# Patient Record
Sex: Female | Born: 1960 | Race: Black or African American | Hispanic: No | Marital: Single | Smoking: Never smoker
Health system: Southern US, Community
[De-identification: ages and names within clinical notes are randomized; demographics above are authoritative.]

## PROBLEM LIST (undated history)

## (undated) DIAGNOSIS — D649 Anemia, unspecified: Secondary | ICD-10-CM

## (undated) DIAGNOSIS — E7800 Pure hypercholesterolemia, unspecified: Secondary | ICD-10-CM

## (undated) HISTORY — DX: Anemia, unspecified: D64.9

## (undated) HISTORY — DX: Pure hypercholesterolemia, unspecified: E78.00

## (undated) HISTORY — PX: TOTAL ABDOMINAL HYSTERECTOMY: SHX209

## (undated) HISTORY — PX: HEMORRHOID SURGERY: SHX153

## (undated) HISTORY — PX: TUBAL LIGATION: SHX77

---

## 1998-07-28 ENCOUNTER — Other Ambulatory Visit: Admission: RE | Admit: 1998-07-28 | Discharge: 1998-07-28 | Payer: Self-pay | Admitting: Family Medicine

## 1998-07-30 ENCOUNTER — Observation Stay (HOSPITAL_COMMUNITY): Admission: AD | Admit: 1998-07-30 | Discharge: 1998-07-31 | Payer: Self-pay | Admitting: Family Medicine

## 1998-08-04 ENCOUNTER — Ambulatory Visit (HOSPITAL_COMMUNITY): Admission: RE | Admit: 1998-08-04 | Discharge: 1998-08-04 | Payer: Self-pay | Admitting: Family Medicine

## 1998-08-04 ENCOUNTER — Encounter: Payer: Self-pay | Admitting: Family Medicine

## 1999-10-29 ENCOUNTER — Encounter: Admission: RE | Admit: 1999-10-29 | Discharge: 1999-10-29 | Payer: Self-pay | Admitting: Family Medicine

## 1999-10-29 ENCOUNTER — Encounter: Payer: Self-pay | Admitting: Family Medicine

## 1999-12-01 ENCOUNTER — Encounter: Admission: RE | Admit: 1999-12-01 | Discharge: 1999-12-01 | Payer: Self-pay | Admitting: Family Medicine

## 1999-12-01 ENCOUNTER — Encounter: Payer: Self-pay | Admitting: Family Medicine

## 2000-10-05 ENCOUNTER — Other Ambulatory Visit: Admission: RE | Admit: 2000-10-05 | Discharge: 2000-10-05 | Payer: Self-pay | Admitting: Family Medicine

## 2001-10-05 ENCOUNTER — Encounter: Admission: RE | Admit: 2001-10-05 | Discharge: 2001-10-05 | Payer: Self-pay | Admitting: Family Medicine

## 2001-10-05 ENCOUNTER — Encounter: Payer: Self-pay | Admitting: Family Medicine

## 2002-11-25 ENCOUNTER — Other Ambulatory Visit: Admission: RE | Admit: 2002-11-25 | Discharge: 2002-11-25 | Payer: Self-pay | Admitting: Family Medicine

## 2003-11-17 ENCOUNTER — Ambulatory Visit (HOSPITAL_COMMUNITY): Admission: RE | Admit: 2003-11-17 | Discharge: 2003-11-17 | Payer: Self-pay | Admitting: Family Medicine

## 2004-12-01 ENCOUNTER — Ambulatory Visit (HOSPITAL_COMMUNITY): Admission: RE | Admit: 2004-12-01 | Discharge: 2004-12-01 | Payer: Self-pay | Admitting: Family Medicine

## 2005-10-12 ENCOUNTER — Ambulatory Visit: Payer: Self-pay | Admitting: Family Medicine

## 2005-10-13 ENCOUNTER — Encounter: Payer: Self-pay | Admitting: Family Medicine

## 2005-10-13 ENCOUNTER — Other Ambulatory Visit: Admission: RE | Admit: 2005-10-13 | Discharge: 2005-10-13 | Payer: Self-pay | Admitting: Family Medicine

## 2005-10-13 ENCOUNTER — Ambulatory Visit: Payer: Self-pay | Admitting: Family Medicine

## 2005-10-27 ENCOUNTER — Ambulatory Visit: Payer: Self-pay | Admitting: Family Medicine

## 2005-11-24 ENCOUNTER — Ambulatory Visit: Payer: Self-pay | Admitting: Family Medicine

## 2005-12-06 ENCOUNTER — Ambulatory Visit (HOSPITAL_COMMUNITY): Admission: RE | Admit: 2005-12-06 | Discharge: 2005-12-06 | Payer: Self-pay | Admitting: Family Medicine

## 2005-12-23 ENCOUNTER — Ambulatory Visit: Payer: Self-pay | Admitting: Family Medicine

## 2006-02-04 ENCOUNTER — Ambulatory Visit: Payer: Self-pay | Admitting: Family Medicine

## 2006-05-11 ENCOUNTER — Inpatient Hospital Stay (HOSPITAL_COMMUNITY): Admission: RE | Admit: 2006-05-11 | Discharge: 2006-05-14 | Payer: Self-pay | Admitting: Obstetrics and Gynecology

## 2006-05-11 ENCOUNTER — Encounter (INDEPENDENT_AMBULATORY_CARE_PROVIDER_SITE_OTHER): Payer: Self-pay | Admitting: Specialist

## 2006-12-15 ENCOUNTER — Ambulatory Visit (HOSPITAL_COMMUNITY): Admission: RE | Admit: 2006-12-15 | Discharge: 2006-12-15 | Payer: Self-pay | Admitting: Family Medicine

## 2007-08-03 ENCOUNTER — Ambulatory Visit: Payer: Self-pay | Admitting: Family Medicine

## 2007-08-03 LAB — CONVERTED CEMR LAB
ALT: 24 units/L (ref 0–35)
AST: 20 units/L (ref 0–37)
Albumin: 3.5 g/dL (ref 3.5–5.2)
CO2: 27 meq/L (ref 19–32)
Cholesterol: 273 mg/dL (ref 0–200)
Eosinophils Relative: 1.3 % (ref 0.0–5.0)
GFR calc Af Amer: 140 mL/min
HCT: 34.9 % — ABNORMAL LOW (ref 36.0–46.0)
HDL: 46 mg/dL (ref >39.0)
Lymphocytes Relative: 46.2 % — ABNORMAL HIGH (ref 12.0–46.0)
MCHC: 34 g/dL (ref 30.0–36.0)
MCV: 83.7 fL (ref 78.0–100.0)
Neutrophils Relative %: 45.3 % (ref 43.0–77.0)
Platelets: 180 10*3/uL (ref 150–400)
RDW: 13.2 % (ref 11.5–14.6)
Specific Gravity, Urine: >=1.03
Total Bilirubin: 0.4 mg/dL (ref 0.3–1.2)
Urobilinogen, UA: 0.2
VLDL: 21 mg/dL (ref 0–40)
WBC Urine, dipstick: NEGATIVE
WBC: 3.6 10*3/uL — ABNORMAL LOW (ref 4.5–10.5)
pH: 5.5

## 2007-08-14 ENCOUNTER — Ambulatory Visit: Payer: Self-pay | Admitting: Family Medicine

## 2007-08-14 DIAGNOSIS — E782 Mixed hyperlipidemia: Secondary | ICD-10-CM

## 2007-08-14 DIAGNOSIS — E663 Overweight: Secondary | ICD-10-CM | POA: Insufficient documentation

## 2007-12-18 ENCOUNTER — Ambulatory Visit (HOSPITAL_COMMUNITY): Admission: RE | Admit: 2007-12-18 | Discharge: 2007-12-18 | Payer: Self-pay | Admitting: Family Medicine

## 2008-08-27 ENCOUNTER — Ambulatory Visit: Payer: Self-pay | Admitting: Family Medicine

## 2008-08-27 LAB — CONVERTED CEMR LAB
ALT: 23 units/L (ref 0–35)
AST: 20 units/L (ref 0–37)
Alkaline Phosphatase: 51 units/L (ref 39–117)
BUN: 10 mg/dL (ref 6–23)
Basophils Absolute: 0.1 10*3/uL (ref 0.0–0.1)
Bilirubin Urine: NEGATIVE
Bilirubin, Direct: 0.1 mg/dL (ref 0.0–0.3)
Blood in Urine, dipstick: NEGATIVE
Direct LDL: 192 mg/dL
Eosinophils Relative: 1.6 % (ref 0.0–5.0)
GFR calc Af Amer: 139 mL/min
GFR calc non Af Amer: 115 mL/min
Glucose, Urine, Semiquant: NEGATIVE
Ketones, urine, test strip: NEGATIVE
Lymphocytes Relative: 46.5 % — ABNORMAL HIGH (ref 12.0–46.0)
MCHC: 33.2 g/dL (ref 30.0–36.0)
Monocytes Absolute: 0.2 10*3/uL (ref 0.1–1.0)
Neutro Abs: 1.6 10*3/uL (ref 1.4–7.7)
Nitrite: NEGATIVE
Platelets: 165 10*3/uL (ref 150–400)
Protein, U semiquant: NEGATIVE
Sodium: 142 meq/L (ref 135–145)
Specific Gravity, Urine: >=1.03
Total CHOL/HDL Ratio: 5.1
Triglycerides: 102 mg/dL (ref 0–149)
Urobilinogen, UA: 0.2
VLDL: 20 mg/dL (ref 0–40)
WBC Urine, dipstick: NEGATIVE
WBC: 3.5 10*3/uL — ABNORMAL LOW (ref 4.5–10.5)
pH: 6

## 2008-09-01 ENCOUNTER — Ambulatory Visit: Payer: Self-pay | Admitting: Family Medicine

## 2008-09-01 DIAGNOSIS — R110 Nausea: Secondary | ICD-10-CM

## 2008-09-04 ENCOUNTER — Encounter: Admission: RE | Admit: 2008-09-04 | Discharge: 2008-09-04 | Payer: Self-pay | Admitting: Family Medicine

## 2008-12-19 ENCOUNTER — Encounter: Admission: RE | Admit: 2008-12-19 | Discharge: 2008-12-19 | Payer: Self-pay | Admitting: Family Medicine

## 2009-09-25 ENCOUNTER — Ambulatory Visit: Payer: Self-pay | Admitting: Family Medicine

## 2009-09-25 DIAGNOSIS — N764 Abscess of vulva: Secondary | ICD-10-CM

## 2009-09-25 LAB — CONVERTED CEMR LAB
AST: 22 units/L (ref 0–37)
Albumin: 3.7 g/dL (ref 3.5–5.2)
BUN: 8 mg/dL (ref 6–23)
Basophils Absolute: 0 10*3/uL (ref 0.0–0.1)
Basophils Relative: 0.9 % (ref 0.0–3.0)
Bilirubin Urine: NEGATIVE
Bilirubin, Direct: 0 mg/dL (ref 0.0–0.3)
CO2: 27 meq/L (ref 19–32)
Cholesterol: 233 mg/dL — ABNORMAL HIGH (ref 0–200)
Direct LDL: 183.4 mg/dL
Glucose, Bld: 88 mg/dL (ref 70–99)
Hemoglobin: 12 g/dL (ref 12.0–15.0)
Lymphs Abs: 1.6 10*3/uL (ref 0.7–4.0)
MCHC: 32.8 g/dL (ref 30.0–36.0)
MCV: 85.4 fL (ref 78.0–100.0)
Monocytes Relative: 5.8 % (ref 3.0–12.0)
Neutro Abs: 1.4 10*3/uL (ref 1.4–7.7)
Platelets: 152 10*3/uL (ref 150.0–400.0)
Potassium: 3.7 meq/L (ref 3.5–5.1)
Protein, U semiquant: NEGATIVE
RDW: 12.6 % (ref 11.5–14.6)
Sodium: 140 meq/L (ref 135–145)
Specific Gravity, Urine: 1.025
Total CHOL/HDL Ratio: 4
VLDL: 16 mg/dL (ref 0.0–40.0)

## 2009-10-09 ENCOUNTER — Ambulatory Visit: Payer: Self-pay | Admitting: Family Medicine

## 2009-12-21 ENCOUNTER — Encounter: Admission: RE | Admit: 2009-12-21 | Discharge: 2009-12-21 | Payer: Self-pay | Admitting: Family Medicine

## 2010-10-05 NOTE — Assessment & Plan Note (Signed)
Summary: CPX // RS   Vital Signs:  Patient profile:   50 year old female Menstrual status:  hysterectomy Height:      67 inches Weight:      207 pounds BMI:     32.54 Temp:     98.5 degrees F oral BP sitting:   130 / 90  (left arm) Cuff size:   regular  Vitals Entered By: Kern Reap CMA Duncan Dull) (October 09, 2009 10:43 AM)  Reason for Visit cpx  History of Present Illness: Kang is a 50 year old female, nonsmoker, who comes in today for physical examination  We saw her last year and her annual exam.  She was complaining of abdominal discomfort.  We set her up for an ultrasound of her gallbladder and advised her to come back for follow-up after the diagnostic study.  She never came back for follow-up.  She thought we were collar on the telephone.  In the meantime, her symptoms have persisted.  She has on a gas burping, belching, indigestion.  Sometimes related to food sometimes not.  She has no abdominal pain.  She takes no medication on a regular basis.  She had her uterus removed many years ago for dysfunction uterine bleeding.  No cancer.  She does not check her breasts monthly.  However, she states she does get regular mammogram.  Tetanus 2003 seasonal flu 2010  Allergies (verified): No Known Drug Allergies  Past History:  Past medical, surgical, family and social histories (including risk factors) reviewed, and no changes noted (except as noted below).  Past Medical History: Reviewed history from 08/14/2007 and no changes required. childbirth x 3 BTL TAH, nonmalignant reasons ovaries intact  Family History: Reviewed history from 08/14/2007 and no changes required. Family History Diabetes 1st degree relative Family History Hypertension  Social History: Reviewed history from 08/14/2007 and no changes required. Occupation: Married Never Smoked Alcohol use-no Drug use-no Regular exercise-yes  Review of Systems      See HPI  Physical Exam  General:   Well-developed,well-nourished,in no acute distress; alert,appropriate and cooperative throughout examination Head:  Normocephalic and atraumatic without obvious abnormalities. No apparent alopecia or balding. Eyes:  No corneal or conjunctival inflammation noted. EOMI. Perrla. Funduscopic exam benign, without hemorrhages, exudates or papilledema. Vision grossly normal. Ears:  External ear exam shows no significant lesions or deformities.  Otoscopic examination reveals clear canals, tympanic membranes are intact bilaterally without bulging, retraction, inflammation or discharge. Hearing is grossly normal bilaterally. Nose:  External nasal examination shows no deformity or inflammation. Nasal mucosa are pink and moist without lesions or exudates. Mouth:  Oral mucosa and oropharynx without lesions or exudates.  Teeth in good repair. Neck:  No deformities, masses, or tenderness noted. Chest Wall:  No deformities, masses, or tenderness noted. Breasts:  No mass, nodules, thickening, tenderness, bulging, retraction, inflamation, nipple discharge or skin changes noted.   Lungs:  Normal respiratory effort, chest expands symmetrically. Lungs are clear to auscultation, no crackles or wheezes. Heart:  Normal rate and regular rhythm. S1 and S2 normal without gallop, murmur, click, rub or other extra sounds. Abdomen:  Bowel sounds positive,abdomen soft and non-tender without masses, organomegaly or hernias noted. Rectal:  No external abnormalities noted. Normal sphincter tone. No rectal masses or tenderness. Genitalia:  Pelvic Exam:        External: normal female genitalia without lesions or masses        Vagina: normal without lesions or masses        Cervix: normal  without lesions or masses        Adnexa: normal bimanual exam without masses or fullness        Uterus: normal by palpation        Pap smear: not performed Msk:  No deformity or scoliosis noted of thoracic or lumbar spine.   Pulses:  R and L  carotid,radial,femoral,dorsalis pedis and posterior tibial pulses are full and equal bilaterally Extremities:  No clubbing, cyanosis, edema, or deformity noted with normal full range of motion of all joints.   Neurologic:  No cranial nerve deficits noted. Station and gait are normal. Plantar reflexes are down-going bilaterally. DTRs are symmetrical throughout. Sensory, motor and coordinative functions appear intact. Skin:  Intact without suspicious lesions or rashes Cervical Nodes:  No lymphadenopathy noted Axillary Nodes:  No palpable lymphadenopathy Inguinal Nodes:  No significant adenopathy Psych:  Cognition and judgment appear intact. Alert and cooperative with normal attention span and concentration. No apparent delusions, illusions, hallucinations   Impression & Recommendations:  Problem # 1:  OVERWEIGHT (ICD-278.02) Assessment Improved  Problem # 2:  Preventive Health Care (ICD-V70.0) Assessment: Unchanged  Patient Instructions: 1)  continue the diet, exercise and weight loss program.  If you decrease your caloric intake to 1500 calories daily drink 30 ounces of water daily and walk 15 minutes daily.  u  will lose 1 pound per week. 2)  Also going to complete lactose free diet for one week.  If y r  still symptomatic after going off lactose call Rachael and we will get you set up for a GI consult. 3)  Get her annual mammogram and do a complete breast exam monthly 4)  Please schedule a follow-up appointment in 1 year. 5)  Schedule your mammogram.   Immunization History:  Influenza Immunization History:    Influenza:  historical (06/05/2009)

## 2010-11-26 ENCOUNTER — Other Ambulatory Visit: Payer: Self-pay | Admitting: Family Medicine

## 2010-11-26 DIAGNOSIS — Z1231 Encounter for screening mammogram for malignant neoplasm of breast: Secondary | ICD-10-CM

## 2010-12-10 ENCOUNTER — Other Ambulatory Visit: Payer: Self-pay

## 2010-12-13 ENCOUNTER — Other Ambulatory Visit (INDEPENDENT_AMBULATORY_CARE_PROVIDER_SITE_OTHER): Payer: BC Managed Care – PPO | Admitting: Family Medicine

## 2010-12-13 DIAGNOSIS — Z0000 Encounter for general adult medical examination without abnormal findings: Secondary | ICD-10-CM

## 2010-12-13 DIAGNOSIS — E785 Hyperlipidemia, unspecified: Secondary | ICD-10-CM

## 2010-12-13 LAB — HEPATIC FUNCTION PANEL
Alkaline Phosphatase: 50 U/L (ref 39–117)
Total Bilirubin: 0.6 mg/dL (ref 0.3–1.2)
Total Protein: 7 g/dL (ref 6.0–8.3)

## 2010-12-13 LAB — BASIC METABOLIC PANEL
Creatinine, Ser: 0.7 mg/dL (ref 0.4–1.2)
GFR: 111.44 mL/min (ref >60.00)
Potassium: 3.3 mEq/L — ABNORMAL LOW (ref 3.5–5.1)

## 2010-12-13 LAB — CBC WITH DIFFERENTIAL/PLATELET
Basophils Absolute: 0 10*3/uL (ref 0.0–0.1)
Basophils Relative: 0.6 % (ref 0.0–3.0)
Eosinophils Absolute: 0.1 10*3/uL (ref 0.0–0.7)
Eosinophils Relative: 3.6 % (ref 0.0–5.0)
HCT: 36.5 % (ref 36.0–46.0)
Lymphocytes Relative: 36.5 % (ref 12.0–46.0)
MCHC: 34 g/dL (ref 30.0–36.0)
Neutro Abs: 2 10*3/uL (ref 1.4–7.7)
WBC: 3.9 10*3/uL — ABNORMAL LOW (ref 4.5–10.5)

## 2010-12-13 LAB — POCT URINALYSIS DIPSTICK
Glucose, UA: NEGATIVE
Nitrite, UA: NEGATIVE
Spec Grav, UA: 1.02
Urobilinogen, UA: 1
pH, UA: 6.5

## 2010-12-13 LAB — LIPID PANEL
Cholesterol: 241 mg/dL — ABNORMAL HIGH (ref 0–200)
VLDL: 8.2 mg/dL (ref 0.0–40.0)

## 2010-12-14 ENCOUNTER — Ambulatory Visit
Admission: RE | Admit: 2010-12-14 | Discharge: 2010-12-14 | Disposition: A | Discharge status: Home / Self Care | Payer: BC Managed Care – PPO | Source: Ambulatory Visit | Attending: Family Medicine | Admitting: Family Medicine

## 2010-12-14 DIAGNOSIS — Z1231 Encounter for screening mammogram for malignant neoplasm of breast: Secondary | ICD-10-CM

## 2010-12-17 ENCOUNTER — Ambulatory Visit (INDEPENDENT_AMBULATORY_CARE_PROVIDER_SITE_OTHER): Payer: BC Managed Care – PPO | Admitting: Family Medicine

## 2011-01-04 ENCOUNTER — Encounter: Payer: Self-pay | Admitting: Family Medicine

## 2011-01-04 ENCOUNTER — Ambulatory Visit (INDEPENDENT_AMBULATORY_CARE_PROVIDER_SITE_OTHER): Payer: BC Managed Care – PPO | Admitting: Family Medicine

## 2011-01-04 VITALS — Temp 98.4°F | Ht 67.5 in | Wt 191.0 lb

## 2011-01-04 DIAGNOSIS — G43719 Chronic migraine without aura, intractable, without status migrainosus: Secondary | ICD-10-CM

## 2011-01-04 MED ORDER — PREDNISONE 20 MG PO TABS: ORAL_TABLET | ORAL | Status: DC

## 2011-01-04 NOTE — Patient Instructions (Signed)
Take the prednisone as directed.  Return if the migraine does not go way.  Return yearly for annual checkup

## 2011-01-04 NOTE — Progress Notes (Signed)
  Subjective:    Patient ID: Brandi Diaz, female    DOB: May 13, 1961, 50 y.o.   MRN: [id number]  HPI  Brandi Diaz is a 50 year old female, nonsmoker, who comes in today for general physical examination,  She's had her uterus removed for nonmalignant lesions.  Ovaries were left intact.  She's had 3 children.  She is now having a headache for the last 2 1/2 weeks.  She says is chronic.  It hurts all day long.  In the, past.  She's had a history of intermittent migraines, never cluster.     Review of Systems  Constitutional: Negative.   Eyes: Negative.   Respiratory: Negative.   Cardiovascular: Negative.   Gastrointestinal: Negative.   Genitourinary: Negative.   Musculoskeletal: Negative.   Neurological: Positive for headaches.  Hematological: Negative.   Psychiatric/Behavioral: Negative.        Objective:   Physical Exam  Constitutional: She appears well-developed and well-nourished.  HENT:  Head: Normocephalic and atraumatic.  Right Ear: External ear normal.  Left Ear: External ear normal.  Nose: Nose normal.  Mouth/Throat: Oropharynx is clear and moist.  Eyes: EOM are normal. Pupils are equal, round, and reactive to light.  Neck: Normal range of motion. Neck supple. No thyromegaly present.  Cardiovascular: Normal rate, regular rhythm, normal heart sounds and intact distal pulses.  Exam reveals no gallop and no friction rub.   No murmur heard. Pulmonary/Chest: Effort normal and breath sounds normal.  Abdominal: Soft. Bowel sounds are normal. She exhibits no distension and no mass. There is no tenderness. There is no rebound.  Genitourinary: Vagina normal. Guaiac negative stool. No vaginal discharge found.       Bilateral breast exam normal  Musculoskeletal: Normal range of motion.  Lymphadenopathy:    She has no cervical adenopathy.  Neurological: She is alert. She has normal reflexes. No cranial nerve deficit. She exhibits normal muscle tone. Coordination normal.  Skin: Skin  is warm and dry.  Psychiatric: She has a normal mood and affect. Her behavior is normal. Judgment and thought content normal.          Assessment & Plan:  Cluster migraines.  Plan prednisone burst and taper

## 2011-01-21 NOTE — Op Note (Signed)
Brandi Diaz, Brandi Diaz                 ACCOUNT NO.:  [id number]   MEDICAL RECORD NO.:  [id number]          PATIENT TYPE:  INP   LOCATION:  9306                          FACILITY:  [facility]   PHYSICIAN:  Richardean Sale, M.D.   DATE OF BIRTH:  December 24, 1960   DATE OF PROCEDURE:  05/11/2006  DATE OF DISCHARGE:                                 OPERATIVE REPORT   PREOPERATIVE DIAGNOSIS:  Menometrorrhagia.   POSTOPERATIVE DIAGNOSES:  1. Menometrorrhagia.  2. Endometriosis.   SURGEON:  Dr. Richardean Sale   ASSISTANT:  Dr. Olivia Mackie   PROCEDURE:  Laparoscopy converted to total abdominal hysterectomy.   ANESTHESIA:  General.   COMPLICATIONS:  None.   ESTIMATED BLOOD LOSS:  46 mL.   OPERATIVE FINDINGS:   LAPAROSCOPIC FINDINGS:  Bulky, enlarged uterus with minimal lateral  mobility, unable to access the uterine vessels laterally.  Normal-appearing  ovaries and tubes.  Normal-appearing liver and gallbladder, adhesions  involving the bladder peritoneum to the lower uterine segment, endometriosis  involving the right uterosacral ligament.   SPECIMENS:  Uterus and cervix sent to pathology.   INDICATIONS:  This is a 44-year gravity 3, para 3 black female who has a  history of progressively worsening menometrorrhagia that is unable to be  controlled with birth control pills or progestins.  The patient presents  today for definitive management with hysterectomy.  She has been counseled  on the different operative approaches as well as alternatives to  hysterectomy, and the patient desires to pursue hysterectomy.  Plan is to  proceed with a total laparoscopic hysterectomy if surgically feasible.  Prior to the procedure, I counseled the patient on the possibility of having  to convert to an open procedure given the patient's history of 3 prior  cesarean deliveries and the potential for scar tissue.  The patient voices  understanding of all of the above.  We discussed the risks of the procedure  which include but are not limited to hemorrhage requiring transfusion;  infection; injury to the bowel, the bladder, the ureters, or other organs  which could require additional surgery; the possibility of deep vein  thrombosis and anesthetic-related complications.  The patient voiced  understanding of all of the above and desires to proceed.  Informed consent  has been obtained and all questions answered.   PROCEDURE:  The patient was taken to the operating room where she was given  a general anesthetic.  She was then prepped and draped in the usual sterile  fashion with Betadine.  A Foley catheter was then placed.  A speculum was  placed in the vagina, and the cervix was visualized.  The cervix was grasped  with a single-tooth tenaculum.  The uterus sounded to 8 cm.  The Rumi  manipulator was then placed into the uterus and the manipulator balloon and  the occluding balloon were then instilled with sterile water.  Speculum was  removed, and attention was then turned to the patient's abdomen.  A 10 mm  skin incision was made with the scalpel.  Prior to doing so, 5 mL of 0.25%  plain Marcaine were injected.  This infraumbilical incision was then carried  down to the fascia.  The fascia was then grasped between 2 Kocher clamps and  incised with Mayo scissors.  The peritoneum was then identified bluntly.  The fascial incision was then secured with a pursestring Vicryl suture, and  the Hasson trocar and sleeve were then introduced.  CO2 gas was allowed to  insufflate, and the laparoscope was introduced.  Upon inspection of the  abdomen and pelvis, the uterus was approximately 14 weeks' size, very bulky  with minimal lateral mobility.  There was endometriosis in the posterior cul-  de-sac over the right uterosacral ligament.  Both ovaries and fallopian  tubes appeared normal.  In the anterior cul-de-sac there were adhesions  involving the bladder flap.  After careful consideration given the  lack of  bladder mobility and inability to safely access and secure the uterine  arteries, the decision was made to terminate the laparoscopic attempt at  hysterectomy and proceed with a total abdominal hysterectomy.  At this  point, CO2 gas was allowed to escape.  The Hasson trocar and sleeve all  instruments were removed from the patient's abdomen.  The fascial  pursestring suture was then tied, and Pfannenstiel skin incision was then  made through the patient's prior cesarean section scar.  This was carried  down sharply to the fascia.  There was a moderate amount of scar tissue  encountered.  The fascia was incised in the midline, and the fascial  incision was then extended laterally with the Mayo scissors.  The superior  and inferior aspects of the fascial incision were then grasped with Kocher  clamps, elevated, and the underlying rectus muscles dissected off with both  sharp and blunt dissection.  The muscles were then separated in the midline,  and peritoneum was entered sharply.  This incision was then extended with  good visualization of the bladder.  There was a moderate amount of scarring  involving the rectus muscles and fascia.  The Balfour retractor was then  placed, and the bowel was packed away with moist laparotomy sponges.  On the  left side, there were adhesion of the colon to the pelvic sidewall that were  obscuring visualization of the ovary.  These adhesions were taken down with  sharp dissection.  At this point, the round ligaments on both sides were  then doubly suture ligated and transected.  The utero-ovarian ligaments on  both sides were then doubly clamped, transected, and suture ligated with  Vicryl suture and were hemostatic.  At this point, the anterior leaf of the  broad ligament was then incised on both sides and the bladder, which was  quite adhered to the lower end segment, was very carefully dissected off the lower segment and cervix with sharp  dissection.  Any areas of bleeding  encountered were cauterized with the Bovie.  Once the bladder was felt to be  adequately displaced, attention was then turned to the uterine arteries  which were skeletonized on both sides.  The uterine arteries were then  doubly clamped, transected, and suture ligated on both sides and were  hemostatic.  The cardinal ligaments were then serially clamped, transected,  and suture ligated.  There was a small amount of backbleeding encountered.  This was cauterized with the Bovie.  The uterosacral ligaments on both sides  were then clamped, transected, and suture ligated.  The bladder was  reinspected, and further sharp dissection was performed to displace the  bladder inferiorly adequately to safely secure the vaginal cuff angles.  Once this was done, 2 curved Heaney clamps were clamped across the vagina  just beneath the cervix, and the uterus and cervix were amputated with the  curved hysterectomy scissors.  The vaginal cuff was then closed with figure-  of-eight Vicryl sutures.  Any areas of bleeding were cauterized with the  Bovie.  At this point, the pelvis was very carefully inspected on the right  side, and the uterine artery pedicle was oozing slightly.  This was grasped  with a right angle, and a Vicryl suture was placed with good hemostasis.  The ureter was inspected and was well beneath the area of bleeding.  The  peritoneum on the left lateral sidewall was oozing.  This was cauterized  with the Bovie.  Any areas on the vaginal cuff that were bleeding were also  cauterized with the Bovie.  A piece of Gelfoam was placed over the vaginal  cuff for additional hemostasis.  The round ligaments were then inspected and  were hemostatic.  The utero-ovarian ligaments were inspected.  On the left  side, there was a small amount of oozing coming from the pedicle.  This was  grasped with a right angle, and an additional suture was placed with good   hemostasis on the utero-ovarian pedicle.  At this point, the pelvis was  irrigated, and all pedicles and the vaginal cuff were very carefully  inspected and were hemostatic.  The bowel was unpacked, and the Balfour  retractor was removed.  The rectus muscles and peritoneum were all carefully  inspected.  Any areas of bleeding were cauterized with the Bovie.  Once  hemostasis was assured and sponge, lap, and needle counts were correct, the  fascia was then closed with a running Vicryl suture.  The subcutaneous space  was then irrigated.  Any areas of bleeding were cauterized with the Bovie,  and the skin was then closed with staples.  The infraumbilical laparoscopy  incision was then closed with a running 4-0 Vicryl subcuticular stitch.   The patient tolerated the procedure very well.  All sponge, lap, needle, and  instrument counts were correct x2.  The patient was taken to the recovery  room awake and in stable condition.  There were no complications.  ADDENDUM:  Please note that prior to proceeding with the abdominal  hysterectomy, the Rumi uterine manipulator was removed.      Richardean Sale, M.D.  Electronically Signed     JW/MEDQ  D:  05/11/2006  T:  05/11/2006  Job:  [id number]

## 2011-01-21 NOTE — Discharge Summary (Signed)
Brandi Diaz, WOOLEN                 ACCOUNT NO.:  [id number]   MEDICAL RECORD NO.:  [id number]           PATIENT TYPE:   LOCATION:                                FACILITY:  [facility]   PHYSICIAN:  Richardean Sale, M.D.        DATE OF BIRTH:   DATE OF ADMISSION:  05/11/2006  DATE OF DISCHARGE:  05/14/2006                                 DISCHARGE SUMMARY   ADMISSION DIAGNOSES:  1. Abnormal uterine bleeding.  2. Menometrorrhagia.   DISCHARGE DIAGNOSES:  1. Abnormal uterine bleeding.  2. Adenomyosis.  3. Uterine fibroids.   PROCEDURES:  Total abdominal hysterectomy performed on May 11, 2006.   HOSPITAL COURSE/HISTORY OF PRESENT ILLNESS:  Please see admission history  and physical for details.  Briefly, this is a 50 year old gravida 3, para 3,  African-American female with progressively worsening menometrorrhagia.  It  has not been controlled with oral hormonal medications.  The patient  presented on May 11, 2006 for definitive management with hysterectomy.  An attempt was made at laparoscopic hysterectomy; however, given the  patient's intra-abdominal scarring from her three prior cesarean sections  and width of the uterus and inability to access the uterine vasculature  safely, the procedure was converted to a total abdominal hysterectomy.  The  patient's procedure was uncomplicated.  Her postoperative course was  uncomplicated.  She remained afebrile throughout her hospital stay.  On  postoperative day #3 she was ambulating well.  She was voiding without  difficulty, passing flatus, and her pain was controlled with oral pain  medications.  She was subsequently discharged to home on postoperative day  #3 in good condition.   LABORATORY DATA:  On postoperative day #1 hemoglobin was 10, hematocrit 30,  platelets 263, white blood cell count 7.5.  Pathology report showed 388 g  uterus with adenomyosis, the largest area measuring 6 x 6 x5 cm.  The total  uterine size was 14 x 9 x 8  cm.  There was no hyperplasia identified.   FOLLOWUP:  The patient will follow up in four weeks for a routine  postoperative visit and again in 48 hours to have her staples removed.   MEDICATIONS:  1. Prescriptions given for Percocet 1-2 tabs p.o. q.4-6 h. p.r.n. pain  2. She is to use Motrin 600 mg p.o. q.6 h. p.r.n. pain.  3. Continue on her iron supplement for an additional month.   INSTRUCTIONS:  The patient is to call for any fever greater than 100.4,  redness or drainage from her incision, increasing pain not relieved with  pain medications, vomiting, or any other questions.      Richardean Sale, M.D.  Electronically Signed     JW/MEDQ  D:  05/14/2006  T:  05/14/2006  Job:  [id number]

## 2011-12-14 ENCOUNTER — Other Ambulatory Visit: Payer: Self-pay | Admitting: Family Medicine

## 2011-12-14 DIAGNOSIS — Z1231 Encounter for screening mammogram for malignant neoplasm of breast: Secondary | ICD-10-CM

## 2012-01-02 ENCOUNTER — Encounter: Payer: BC Managed Care – PPO | Admitting: Family Medicine

## 2012-01-05 ENCOUNTER — Ambulatory Visit (INDEPENDENT_AMBULATORY_CARE_PROVIDER_SITE_OTHER): Payer: BC Managed Care – PPO | Admitting: Family Medicine

## 2012-01-05 ENCOUNTER — Encounter: Payer: Self-pay | Admitting: Family Medicine

## 2012-01-05 VITALS — BP 110/80 | Temp 98.3°F | Ht 68.0 in | Wt 210.0 lb

## 2012-01-05 DIAGNOSIS — Z0000 Encounter for general adult medical examination without abnormal findings: Secondary | ICD-10-CM | POA: Insufficient documentation

## 2012-01-05 DIAGNOSIS — E663 Overweight: Secondary | ICD-10-CM

## 2012-01-05 DIAGNOSIS — G43719 Chronic migraine without aura, intractable, without status migrainosus: Secondary | ICD-10-CM

## 2012-01-05 DIAGNOSIS — Z23 Encounter for immunization: Secondary | ICD-10-CM

## 2012-01-05 LAB — LIPID PANEL
HDL: 50.3 mg/dL (ref >39.00)
Triglycerides: 144 mg/dL (ref 0.0–149.0)
VLDL: 28.8 mg/dL (ref 0.0–40.0)

## 2012-01-05 LAB — HEPATIC FUNCTION PANEL
Albumin: 3.1 g/dL — ABNORMAL LOW (ref 3.5–5.2)
Bilirubin, Direct: 0 mg/dL (ref 0.0–0.3)

## 2012-01-05 LAB — CBC WITH DIFFERENTIAL/PLATELET
Basophils Absolute: 0 10*3/uL (ref 0.0–0.1)
Basophils Relative: 0.4 % (ref 0.0–3.0)
Eosinophils Absolute: 0.1 10*3/uL (ref 0.0–0.7)
Eosinophils Relative: 2.3 % (ref 0.0–5.0)
Lymphocytes Relative: 40.1 % (ref 12.0–46.0)
Lymphs Abs: 2.1 10*3/uL (ref 0.7–4.0)
MCHC: 32.9 g/dL (ref 30.0–36.0)
MCV: 84.6 fl (ref 78.0–100.0)
Monocytes Relative: 5.9 % (ref 3.0–12.0)
RBC: 4.29 Mil/uL (ref 3.87–5.11)
WBC: 5.2 10*3/uL (ref 4.5–10.5)

## 2012-01-05 LAB — POCT URINALYSIS DIPSTICK
Ketones, UA: NEGATIVE
Leukocytes, UA: NEGATIVE
Nitrite, UA: NEGATIVE
Spec Grav, UA: 1.02
pH, UA: 6.5

## 2012-01-05 LAB — BASIC METABOLIC PANEL
CO2: 28 mEq/L (ref 19–32)
Calcium: 8.6 mg/dL (ref 8.4–10.5)
Chloride: 107 mEq/L (ref 96–112)
Creatinine, Ser: 0.5 mg/dL (ref 0.4–1.2)
GFR: 177.14 mL/min (ref >60.00)
Potassium: 4.1 mEq/L (ref 3.5–5.1)
Sodium: 140 mEq/L (ref 135–145)

## 2012-01-05 MED ORDER — TRAMADOL HCL 50 MG PO TABS: ORAL_TABLET | ORAL | Status: DC

## 2012-01-05 MED ORDER — NADOLOL 20 MG PO TABS: 20.0000 mg | ORAL_TABLET | Freq: Every day | ORAL | Status: DC

## 2012-01-05 NOTE — Progress Notes (Signed)
  Subjective:    Patient ID: Brandi Diaz, female    DOB: [birth year], 51 y.o.   MRN: [id number]  HPI Brandi Diaz is a 51 year old single female nonsmoker who lives with her mother Brandi Diaz who comes in today for general medical examination  She had her uterus removed 10 years ago for dysfunctional uterine bleeding ovaries were left intact  She's had a history of migraine headaches they've decreased in intensity but now they occur mostly every day.  She does have trouble with some fungal infection on her fingernails  Her weight continues to be an issue she 68 inches tall and weighs 210 pounds blood pressure normal  She gets routine eye care, dental care, BSE monthly, and you mammography   Review of Systems  Constitutional: Negative.   HENT: Negative.   Eyes: Negative.   Respiratory: Negative.   Cardiovascular: Negative.   Gastrointestinal: Negative.   Genitourinary: Negative.   Musculoskeletal: Negative.   Neurological: Negative.   Hematological: Negative.   Psychiatric/Behavioral: Negative.        Objective:   Physical Exam  Constitutional: She appears well-developed and well-nourished.  HENT:  Head: Normocephalic and atraumatic.  Right Ear: External ear normal.  Left Ear: External ear normal.  Nose: Nose normal.  Mouth/Throat: Oropharynx is clear and moist.  Eyes: EOM are normal. Pupils are equal, round, and reactive to light.  Neck: Normal range of motion. Neck supple. No thyromegaly present.  Cardiovascular: Normal rate, regular rhythm, normal heart sounds and intact distal pulses.  Exam reveals no gallop and no friction rub.   No murmur heard. Pulmonary/Chest: Effort normal and breath sounds normal.  Abdominal: Soft. Bowel sounds are normal. She exhibits no distension and no mass. There is no tenderness. There is no rebound.  Genitourinary: Vagina normal. Guaiac negative stool. No vaginal discharge found.  Musculoskeletal: Normal range of motion.  Lymphadenopathy:     She has no cervical adenopathy.  Neurological: She is alert. She has normal reflexes. No cranial nerve deficit. She exhibits normal muscle tone. Coordination normal.  Skin: Skin is warm and dry.  Psychiatric: She has a normal mood and affect. Her behavior is normal. Judgment and thought content normal.          Assessment & Plan:  Healthy female  Overweight again encouraged diet exercise and weight loss  Migraine headaches continue Motrin tramadol one half tab when necessary  Also add Corgard 20 mg daily to try to break this cycle of the migraines daily  Status post hysterectomy

## 2012-01-05 NOTE — Patient Instructions (Signed)
Begin D. Corgard one tablet daily to prevent the migraine headache  Motrin 600 mg as needed for breakthrough migraine  Tramadol one half to one tablet as needed if the Motrin does not work for the migraine  Return in one month for followup

## 2012-01-13 ENCOUNTER — Ambulatory Visit
Admission: RE | Admit: 2012-01-13 | Discharge: 2012-01-13 | Disposition: A | Discharge status: Home / Self Care | Payer: BC Managed Care – PPO | Source: Ambulatory Visit | Attending: Family Medicine | Admitting: Family Medicine

## 2012-01-13 DIAGNOSIS — Z1231 Encounter for screening mammogram for malignant neoplasm of breast: Secondary | ICD-10-CM

## 2012-02-06 ENCOUNTER — Ambulatory Visit: Payer: BC Managed Care – PPO | Admitting: Family Medicine

## 2012-08-17 ENCOUNTER — Telehealth: Payer: Self-pay | Admitting: Family Medicine

## 2012-08-17 NOTE — Telephone Encounter (Signed)
Patient Information:  Caller Name: Rufina  Phone: [phone]  Patient: Brandi Diaz, Brandi Diaz  Gender: Female  DOB: Oct 19, 1960  Age: 51 Years  PCP: Kelle Darting [facility])  Pregnant: No  Office Follow Up:  Does the office need to follow up with this patient?: No  Instructions For The Office: N/A   Symptoms  Reason For Call & Symptoms: Has headache "for a while". Has histiory of migraines.  Reviewed Health History In EMR: Yes  Reviewed Medications In EMR: Yes  Reviewed Allergies In EMR: Yes  Reviewed Surgeries / Procedures: Yes  Date of Onset of Symptoms: 07/18/2012  Treatments Tried: BC Powder  Treatments Tried Worked: No OB:  LMP: Unknown  Guideline(s) Used:  Headache  Headache  Disposition Per Guideline:  See provider today or tomorrow. Appointment scheduled for 12-14 at 0915.  Reason For Disposition Reached:   Mild-moderate headache  Advice Given:  Reassurance - Migraine Headache:  This sounds like a painful headache that you are having, but there are pain medications you can take and other instructions I can give you to reduce the pain.  Pain Medicines:  For pain relief, you can take either acetaminophen, ibuprofen, or naproxen.  Apply Cold to the Area:   Apply a cold wet washcloth or cold pack to the forehead for 20 minutes.

## 2012-08-18 ENCOUNTER — Encounter: Payer: Self-pay | Admitting: Internal Medicine

## 2012-08-18 ENCOUNTER — Ambulatory Visit (INDEPENDENT_AMBULATORY_CARE_PROVIDER_SITE_OTHER): Payer: BC Managed Care – PPO | Admitting: Internal Medicine

## 2012-08-18 VITALS — BP 126/88 | HR 94 | Temp 98.2°F | Ht 68.0 in | Wt 210.0 lb

## 2012-08-18 DIAGNOSIS — R51 Headache: Secondary | ICD-10-CM

## 2012-08-18 DIAGNOSIS — Z0000 Encounter for general adult medical examination without abnormal findings: Secondary | ICD-10-CM

## 2012-08-18 DIAGNOSIS — G43719 Chronic migraine without aura, intractable, without status migrainosus: Secondary | ICD-10-CM

## 2012-08-18 DIAGNOSIS — E663 Overweight: Secondary | ICD-10-CM

## 2012-08-18 MED ORDER — AMITRIPTYLINE HCL 25 MG PO TABS: 25.0000 mg | ORAL_TABLET | Freq: Every day | ORAL | Status: DC

## 2012-08-18 MED ORDER — TRAMADOL HCL 50 MG PO TABS: 50.0000 mg | ORAL_TABLET | Freq: Every day | ORAL | Status: DC | PRN

## 2012-08-18 NOTE — Patient Instructions (Signed)
Please stop all regular pain relievers Use the prescription tramadol only for most severe headache and at most once a day Make sure you are not taking any caffeine Set up a follow up with Dr Tawanna Cooler in 2 weeks or so

## 2012-08-18 NOTE — Assessment & Plan Note (Addendum)
Clearly has converted her migraine into a chronic daily headache Nadolol didn't really help Discussed the need to stop daily analgesics Stop caffeine (may be in that morning drink) Trial of amitryipylline 25-50 Tramadol only for most severe headache  Needs follow up soon with Dr Tawanna Cooler May need to consider referral to headache specialist

## 2012-08-18 NOTE — Progress Notes (Signed)
  Subjective:    Patient ID: Brandi Diaz, female    DOB: Jun 28, 1961, 51 y.o.   MRN: [id number]  HPI Having a bad headache Not sure if it is a migraine but probably Started a couple of months ago to be worse but has been ongoing Daily headaches for many months  Was given nadolol but found no help Doubled up to 40mg  but still no help---so she stopped  Only thing that helped was BCs Taking every 3-4 hours over past week Extra strength tylenol not helping Takes something everyday  Ears are clogged and hearing is different Pounding all over head May be some better by putting direct pressure on it Movement worsens it Lying down does help  No sig photophobia or phonophobia No sig nausea now---only rare nausea and had 1 vomiting spell 5 days ago  Generally doesn't use any caffeine--- does have breakfast chocolate shake  Current Outpatient Prescriptions on File Prior to Visit  Medication Sig Dispense Refill  . nadolol (CORGARD) 20 MG tablet Take 1 tablet (20 mg total) by mouth daily.  100 tablet  3  . traMADol (ULTRAM) 50 MG tablet One half tablet every 6-8 hours when necessary for breakthrough migraine  30 tablet  3    No Known Allergies  No past medical history on file.  Past Surgical History  Procedure Date  . Tubal ligation   . Total abdominal hysterectomy     nonmalignant reasons - ovaries intact    Family History  Problem Relation Age of Onset  . Diabetes Other   . Hypertension Other     History   Social History  . Marital Status: Single    Spouse Name: N/A    Number of Children: N/A  . Years of Education: N/A   Occupational History  . Not on file.   Social History Main Topics  . Smoking status: Never Smoker   . Smokeless tobacco: Not on file  . Alcohol Use: No  . Drug Use:   . Sexually Active:    Other Topics Concern  . Not on file   Social History Narrative  . No narrative on file   Review of Systems No unilateral vision loss No facial  droop, focal weakness, aphasia or dysphagia    Objective:   Physical Exam  Constitutional: She appears well-developed and well-nourished.       Clearly uncomfortable and in pain  HENT:  Mouth/Throat: Oropharynx is clear and moist. No oropharyngeal exudate.  Eyes: Conjunctivae normal and EOM are normal. Pupils are equal, round, and reactive to light.  Neck: Normal range of motion. No thyromegaly present.  Lymphadenopathy:    She has no cervical adenopathy.  Neurological: She is alert. She has normal strength. She displays no atrophy and no tremor. No cranial nerve deficit. She exhibits normal muscle tone. She displays a negative Romberg sign. Coordination and gait normal.          Assessment & Plan:

## 2012-09-24 ENCOUNTER — Ambulatory Visit (INDEPENDENT_AMBULATORY_CARE_PROVIDER_SITE_OTHER): Payer: BC Managed Care – PPO | Admitting: Family Medicine

## 2012-09-24 ENCOUNTER — Encounter: Payer: Self-pay | Admitting: Family Medicine

## 2012-09-24 VITALS — BP 140/96 | Temp 98.5°F | Wt 218.0 lb

## 2012-09-24 DIAGNOSIS — E663 Overweight: Secondary | ICD-10-CM

## 2012-09-24 DIAGNOSIS — R030 Elevated blood-pressure reading, without diagnosis of hypertension: Secondary | ICD-10-CM

## 2012-09-24 DIAGNOSIS — G43719 Chronic migraine without aura, intractable, without status migrainosus: Secondary | ICD-10-CM

## 2012-09-24 MED ORDER — AMITRIPTYLINE HCL 50 MG PO TABS: 50.0000 mg | ORAL_TABLET | Freq: Every day | ORAL | Status: DC

## 2012-09-24 NOTE — Patient Instructions (Addendum)
Continue Elavil 50 mg one daily at bedtime  Set up an appointment in May for general physical exam  Fasting labs one week prior  Okay to donate plasma  Purchase a digital blood pressure cuff and check your blood pressure weekly at home. When you return in may bring a record of all your blood pressure readings  Blood pressure goal 135/85 or less

## 2012-09-24 NOTE — Progress Notes (Signed)
  Subjective:    Patient ID: Brandi Diaz, female    DOB: 12-16-1960, 52 y.o.   MRN: [id number]  HPI Brandi Diaz is a 52 year old married female nonsmoker who comes in today for evaluation of 2 problems  She was seen in the Saturday clinic for chronic daily headaches and was given amitriptyline 50 mg daily and comes back today for followup. She states the tramadol didn't help the pain but now her headaches are pretty much gone on the amitriptyline. No side effects from the medication.  She was first diagnosed as migraines in 1998 and up until this time they've been episodic. In the winter they became chronic and she was seen in the Saturday clinic as noted above.  She donates plasma and recently was denied because her blood pressure was elevated. BP when she came in was 140/96  After her exam BP rechecked 130/80  Positive family history of hypertension   Review of Systems    review of systems otherwise negative Objective:   Physical Exam Well-developed well-nourished female no acute distress BP right arm sitting position initially 140/96 at the end of the session was 130/80.       Assessment & Plan:  Cluster migraines much improved by Elavil 50 mg daily continue current medication  Elevated blood pressure okay to donate plasma BP checked daily at home followup CPX when due

## 2012-12-18 ENCOUNTER — Other Ambulatory Visit: Payer: BC Managed Care – PPO

## 2012-12-18 ENCOUNTER — Other Ambulatory Visit (INDEPENDENT_AMBULATORY_CARE_PROVIDER_SITE_OTHER): Payer: BC Managed Care – PPO

## 2012-12-18 DIAGNOSIS — R030 Elevated blood-pressure reading, without diagnosis of hypertension: Secondary | ICD-10-CM

## 2012-12-18 LAB — CBC WITH DIFFERENTIAL/PLATELET
Eosinophils Absolute: 0.1 10*3/uL (ref 0.0–0.7)
Eosinophils Relative: 2.6 % (ref 0.0–5.0)
Lymphocytes Relative: 44.6 % (ref 12.0–46.0)
MCV: 84.9 fl (ref 78.0–100.0)
Monocytes Absolute: 0.3 10*3/uL (ref 0.1–1.0)
Neutrophils Relative %: 43.4 % (ref 43.0–77.0)
Platelets: 196 10*3/uL (ref 150.0–400.0)
WBC: 3.9 10*3/uL — ABNORMAL LOW (ref 4.5–10.5)

## 2012-12-18 LAB — LIPID PANEL
Cholesterol: 267 mg/dL — ABNORMAL HIGH (ref 0–200)
Triglycerides: 98 mg/dL (ref 0.0–149.0)

## 2012-12-18 LAB — POCT URINALYSIS DIPSTICK
Ketones, UA: NEGATIVE
Leukocytes, UA: NEGATIVE
Urobilinogen, UA: 1

## 2012-12-18 LAB — HEPATIC FUNCTION PANEL
ALT: 20 U/L (ref 0–35)
AST: 20 U/L (ref 0–37)
Total Bilirubin: 0.6 mg/dL (ref 0.3–1.2)
Total Protein: 7.3 g/dL (ref 6.0–8.3)

## 2012-12-18 LAB — BASIC METABOLIC PANEL
Calcium: 8.9 mg/dL (ref 8.4–10.5)
Creatinine, Ser: 0.7 mg/dL (ref 0.4–1.2)

## 2012-12-18 LAB — TSH: TSH: 0.78 u[IU]/mL (ref 0.35–5.50)

## 2012-12-18 LAB — LDL CHOLESTEROL, DIRECT: Direct LDL: 203.4 mg/dL

## 2013-01-07 ENCOUNTER — Encounter: Payer: BC Managed Care – PPO | Admitting: Family Medicine

## 2013-01-07 ENCOUNTER — Encounter: Payer: Self-pay | Admitting: Family Medicine

## 2013-01-07 ENCOUNTER — Ambulatory Visit (INDEPENDENT_AMBULATORY_CARE_PROVIDER_SITE_OTHER): Payer: BC Managed Care – PPO | Admitting: Family Medicine

## 2013-01-07 VITALS — BP 120/86 | Temp 98.0°F | Ht 67.5 in | Wt 217.0 lb

## 2013-01-07 DIAGNOSIS — G43719 Chronic migraine without aura, intractable, without status migrainosus: Secondary | ICD-10-CM

## 2013-01-07 DIAGNOSIS — E663 Overweight: Secondary | ICD-10-CM

## 2013-01-07 DIAGNOSIS — E782 Mixed hyperlipidemia: Secondary | ICD-10-CM

## 2013-01-07 DIAGNOSIS — Z0000 Encounter for general adult medical examination without abnormal findings: Secondary | ICD-10-CM

## 2013-01-07 MED ORDER — AMITRIPTYLINE HCL 50 MG PO TABS: 50.0000 mg | ORAL_TABLET | Freq: Every day | ORAL | Status: DC

## 2013-01-07 NOTE — Patient Instructions (Signed)
Continue the Elavil 50 mg,,,,,,,,,,, 1 at bedtime  Continue exercise program  Do a thorough breast exam monthly and get a mammogram yearly  Return in one year sooner if any problems

## 2013-01-07 NOTE — Progress Notes (Signed)
  Subjective:    Patient ID: Brandi Diaz, female    DOB: 28-Mar-1961, 52 y.o.   MRN: [id number]  HPI Brandi Diaz is a 52 year old female nonsmoker who comes in today for general physical examination  She takes 50 mg of Elavil each bedtime to prevent migraine headaches. She says she is fairly headache free on the Elavil and feels fine.  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  She had her uterus removed for dysfunction uterine bleeding no cancer ovaries were left intact.   Review of Systems  Constitutional: Negative.   HENT: Negative.   Eyes: Negative.   Respiratory: Negative.   Cardiovascular: Negative.   Gastrointestinal: Negative.   Genitourinary: Negative.   Musculoskeletal: Negative.   Neurological: Negative.   Psychiatric/Behavioral: Negative.        Objective:   Physical Exam  Constitutional: She appears well-developed and well-nourished.  HENT:  Head: Normocephalic and atraumatic.  Right Ear: External ear normal.  Left Ear: External ear normal.  Nose: Nose normal.  Mouth/Throat: Oropharynx is clear and moist.  Eyes: EOM are normal. Pupils are equal, round, and reactive to light.  Neck: Normal range of motion. Neck supple. No thyromegaly present.  Cardiovascular: Normal rate, regular rhythm, normal heart sounds and intact distal pulses.  Exam reveals no gallop and no friction rub.   No murmur heard. Pulmonary/Chest: Effort normal and breath sounds normal.  Abdominal: Soft. Bowel sounds are normal. She exhibits no distension and no mass. There is no tenderness. There is no rebound.  Genitourinary: Vagina normal. Guaiac negative stool. No vaginal discharge found.  Bilateral breast exam normal  Musculoskeletal: Normal range of motion.  Lymphadenopathy:    She has no cervical adenopathy.  Neurological: She is alert. She has normal reflexes.  No cranial nerve deficit. She exhibits normal muscle tone. Coordination normal.  Skin: Skin is warm and dry.  Psychiatric: She has a normal mood and affect. Her behavior is normal. Judgment and thought content normal.          Assessment & Plan:  Healthy female  Migraine headaches continue Elavil 50 mg each bedtime  Status post hysterectomy  Overweight........... She is exercising 5 days weekly encouraged her to continue the

## 2013-03-26 ENCOUNTER — Other Ambulatory Visit: Payer: Self-pay

## 2013-03-26 DIAGNOSIS — Z1231 Encounter for screening mammogram for malignant neoplasm of breast: Secondary | ICD-10-CM

## 2013-04-12 ENCOUNTER — Ambulatory Visit
Admission: RE | Admit: 2013-04-12 | Discharge: 2013-04-12 | Disposition: A | Discharge status: Home / Self Care | Payer: BC Managed Care – PPO | Source: Ambulatory Visit

## 2013-04-12 DIAGNOSIS — Z1231 Encounter for screening mammogram for malignant neoplasm of breast: Secondary | ICD-10-CM

## 2014-01-15 ENCOUNTER — Other Ambulatory Visit (INDEPENDENT_AMBULATORY_CARE_PROVIDER_SITE_OTHER): Payer: BC Managed Care – PPO

## 2014-01-15 DIAGNOSIS — Z0000 Encounter for general adult medical examination without abnormal findings: Secondary | ICD-10-CM

## 2014-01-15 LAB — POCT URINALYSIS DIPSTICK
Bilirubin, UA: NEGATIVE
Blood, UA: NEGATIVE
GLUCOSE UA: NEGATIVE
Ketones, UA: NEGATIVE
LEUKOCYTES UA: NEGATIVE
NITRITE UA: NEGATIVE
SPEC GRAV UA: 1.015
UROBILINOGEN UA: 0.2
pH, UA: 8.5

## 2014-01-15 LAB — CBC WITH DIFFERENTIAL/PLATELET
BASOS ABS: 0 10*3/uL (ref 0.0–0.1)
Basophils Relative: 0.6 % (ref 0.0–3.0)
EOS ABS: 0.1 10*3/uL (ref 0.0–0.7)
Eosinophils Relative: 1.3 % (ref 0.0–5.0)
HCT: 38.7 % (ref 36.0–46.0)
HEMOGLOBIN: 12.7 g/dL (ref 12.0–15.0)
LYMPHS PCT: 44 % (ref 12.0–46.0)
Lymphs Abs: 1.9 10*3/uL (ref 0.7–4.0)
MCHC: 32.9 g/dL (ref 30.0–36.0)
MCV: 83.4 fl (ref 78.0–100.0)
Monocytes Absolute: 0.2 10*3/uL (ref 0.1–1.0)
Monocytes Relative: 5.8 % (ref 3.0–12.0)
NEUTROS ABS: 2 10*3/uL (ref 1.4–7.7)
Neutrophils Relative %: 48.3 % (ref 43.0–77.0)
Platelets: 181 10*3/uL (ref 150.0–400.0)
RBC: 4.64 Mil/uL (ref 3.87–5.11)
RDW: 15.9 % — AB (ref 11.5–15.5)
WBC: 4.2 10*3/uL (ref 4.0–10.5)

## 2014-01-15 LAB — TSH: TSH: 1.69 u[IU]/mL (ref 0.35–4.50)

## 2014-01-15 LAB — LIPID PANEL
CHOLESTEROL: 230 mg/dL — AB (ref 0–200)
HDL: 53.4 mg/dL (ref >39.00)
LDL Cholesterol: 150 mg/dL — ABNORMAL HIGH (ref 0–99)
TRIGLYCERIDES: 135 mg/dL (ref 0.0–149.0)
Total CHOL/HDL Ratio: 4
VLDL: 27 mg/dL (ref 0.0–40.0)

## 2014-01-15 LAB — BASIC METABOLIC PANEL
BUN: 8 mg/dL (ref 6–23)
CALCIUM: 8.5 mg/dL (ref 8.4–10.5)
CO2: 28 meq/L (ref 19–32)
CREATININE: 0.6 mg/dL (ref 0.4–1.2)
Chloride: 109 mEq/L (ref 96–112)
GFR: 147.04 mL/min (ref >60.00)
GLUCOSE: 92 mg/dL (ref 70–99)
Potassium: 4.2 mEq/L (ref 3.5–5.1)
Sodium: 140 mEq/L (ref 135–145)

## 2014-01-15 LAB — HEPATIC FUNCTION PANEL
ALBUMIN: 2.9 g/dL — AB (ref 3.5–5.2)
ALK PHOS: 36 U/L — AB (ref 39–117)
ALT: 30 U/L (ref 0–35)
AST: 28 U/L (ref 0–37)
Bilirubin, Direct: 0 mg/dL (ref 0.0–0.3)
Total Bilirubin: 0.5 mg/dL (ref 0.2–1.2)
Total Protein: 5.2 g/dL — ABNORMAL LOW (ref 6.0–8.3)

## 2014-01-22 ENCOUNTER — Ambulatory Visit (INDEPENDENT_AMBULATORY_CARE_PROVIDER_SITE_OTHER): Payer: BC Managed Care – PPO | Admitting: Family Medicine

## 2014-01-22 ENCOUNTER — Encounter: Payer: Self-pay | Admitting: Family Medicine

## 2014-01-22 VITALS — BP 110/80 | Temp 98.5°F | Ht 67.75 in | Wt 208.0 lb

## 2014-01-22 DIAGNOSIS — Z0000 Encounter for general adult medical examination without abnormal findings: Secondary | ICD-10-CM

## 2014-01-22 DIAGNOSIS — E663 Overweight: Secondary | ICD-10-CM

## 2014-01-22 NOTE — Progress Notes (Signed)
Pre visit review using our clinic review tool, if applicable. No additional management support is needed unless otherwise documented below in the visit note. 

## 2014-01-22 NOTE — Patient Instructions (Signed)
Continue good exercise and dietary program  Followup in 1 year sooner if any problems  We will schedule to go to GI to be in the process of having her first colonoscopy  BSE monthly and annual mammography

## 2014-01-22 NOTE — Progress Notes (Signed)
   Subjective:    Patient ID: Brandi Diaz, female    DOB: [birth year], 53 y.o.   MRN: [id number]  HPI Brandi Diaz is a 53 year old female nonsmoker who comes in today for general physical examination  She gets routine eye care, dental care, and you mammography but does not do BSE monthly, do for first colonoscopy  She had her uterus removed many years ago for dysfunction uterine bleeding or cancers ovaries were left intact she is asymptomatic therefore pelvic exam not indicated.  Vaccinations up-to-date  She is improved her fitness by going to the gym 5 days a week. Also said helped her blood pressures Brandi LeashDonna 110/80   Review of Systems  Constitutional: Negative.   HENT: Negative.   Eyes: Negative.   Respiratory: Negative.   Cardiovascular: Negative.   Gastrointestinal: Negative.   Genitourinary: Negative.   Musculoskeletal: Negative.   Neurological: Negative.   Psychiatric/Behavioral: Negative.        Objective:   Physical Exam  Constitutional: She appears well-developed and well-nourished.  HENT:  Head: Normocephalic and atraumatic.  Right Ear: External ear normal.  Left Ear: External ear normal.  Nose: Nose normal.  Mouth/Throat: Oropharynx is clear and moist.  Eyes: EOM are normal. Pupils are equal, round, and reactive to light.  Neck: Normal range of motion. Neck supple. No thyromegaly present.  Cardiovascular: Normal rate, regular rhythm, normal heart sounds and intact distal pulses.  Exam reveals no gallop and no friction rub.   No murmur heard. Pulmonary/Chest: Effort normal and breath sounds normal.  Abdominal: Soft. Bowel sounds are normal. She exhibits no distension and no mass. There is no tenderness. There is no rebound.  Genitourinary:  Bilateral breast exam normal  Musculoskeletal: Normal range of motion.  Lymphadenopathy:    She has no cervical adenopathy.  Neurological: She is alert. She has normal reflexes. No cranial nerve deficit. She exhibits normal  muscle tone. Coordination normal.  Skin: Skin is warm and dry.  Psychiatric: She has a normal mood and affect. Her behavior is normal. Judgment and thought content normal.          Assessment & Plan:  Healthy female  Schedule for first screening colonoscopy  And you mammography and BSE monthly at home

## 2014-01-23 ENCOUNTER — Other Ambulatory Visit: Payer: Self-pay

## 2014-01-23 DIAGNOSIS — Z1231 Encounter for screening mammogram for malignant neoplasm of breast: Secondary | ICD-10-CM

## 2014-02-27 ENCOUNTER — Encounter: Payer: Self-pay | Admitting: Family Medicine

## 2014-04-14 ENCOUNTER — Ambulatory Visit
Admission: RE | Admit: 2014-04-14 | Discharge: 2014-04-14 | Disposition: A | Discharge status: Home / Self Care | Payer: BC Managed Care – PPO | Source: Ambulatory Visit

## 2014-04-14 DIAGNOSIS — Z1231 Encounter for screening mammogram for malignant neoplasm of breast: Secondary | ICD-10-CM

## 2015-01-27 ENCOUNTER — Encounter: Payer: Self-pay | Admitting: Family Medicine

## 2015-01-27 ENCOUNTER — Ambulatory Visit (INDEPENDENT_AMBULATORY_CARE_PROVIDER_SITE_OTHER): Payer: BC Managed Care – PPO | Admitting: Family Medicine

## 2015-01-27 VITALS — BP 120/80 | Temp 98.2°F | Ht 68.5 in | Wt 198.0 lb

## 2015-01-27 DIAGNOSIS — Z0000 Encounter for general adult medical examination without abnormal findings: Secondary | ICD-10-CM | POA: Diagnosis not present

## 2015-01-27 NOTE — Patient Instructions (Signed)
Continue exercise diet and good health habits  Follow-up in one year for general physical exam sooner if any problems  [employer]GE EnergyCory nafzinger..........Marland Kitchen. our new adult nurse practitioner  We will set you up a time in GI to discuss screening colonoscopy

## 2015-01-27 NOTE — Progress Notes (Signed)
Pre visit review using our clinic review tool, if applicable. No additional management support is needed unless otherwise documented below in the visit note. 

## 2015-01-27 NOTE — Progress Notes (Signed)
   Subjective:    Patient ID: Brandi Diaz, female    DOB: 1961/02/16, 54 y.o.   MRN: [id number]  HPI Brandi Diaz is a 54 year old single female nonsmoker who comes in today for general physical examination  She gets routine eye care, dental care, BSE monthly, annual mammography.  She had a hysterectomy in 2002 for nonmalignant reasons. Ovaries were left intact. She has no menopausal symptoms.  She works out at [employer] 4-5 days per week. 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  Vaccinations up-to-date  Do a colonoscopy   Review of Systems  Constitutional: Negative.   HENT: Negative.   Eyes: Negative.   Respiratory: Negative.   Cardiovascular: Negative.   Gastrointestinal: Negative.   Endocrine: Negative.   Genitourinary: Negative.   Musculoskeletal: Negative.   Skin: Negative.   Allergic/Immunologic: Negative.   Neurological: Negative.   Hematological: Negative.   Psychiatric/Behavioral: Negative.        Objective:   Physical Exam  Constitutional: She appears well-developed and well-nourished.  HENT:  Head: Normocephalic and atraumatic.  Right Ear: External ear normal.  Left Ear: External ear normal.  Nose: Nose normal.  Mouth/Throat: Oropharynx is clear and moist.  Eyes: EOM are normal. Pupils are equal, round, and reactive to light.  Neck: Normal range of motion. Neck supple. No JVD present. No tracheal deviation present. No thyromegaly present.  Cardiovascular: Normal rate, regular rhythm, normal heart sounds and intact distal pulses.  Exam reveals no gallop and no friction rub.   No murmur heard. Pulmonary/Chest: Effort normal and breath sounds normal. No stridor. No respiratory distress. She has no wheezes. She has no rales. She exhibits no tenderness.  Abdominal: Soft. Bowel sounds are normal. She exhibits no distension and no mass. There is no tenderness. There is no rebound and no guarding.  Genitourinary:  Bilateral breast exam  normal  Musculoskeletal: Normal range of motion.  Lymphadenopathy:    She has no cervical adenopathy.  Neurological: She is alert. She has normal reflexes. No cranial nerve deficit. She exhibits normal muscle tone. Coordination normal.  Skin: Skin is warm and dry. No rash noted. No erythema. No pallor.  Psychiatric: She has a normal mood and affect. Her behavior is normal. Judgment and thought content normal.  Nursing note and vitals reviewed.         Assessment & Plan:  Healthy female  Slightly overweight BMI 31.85,,,,,, continue exercise and diet  Set up for screening colonoscopy

## 2015-02-03 ENCOUNTER — Encounter: Payer: Self-pay | Admitting: Gastroenterology

## 2015-03-05 ENCOUNTER — Other Ambulatory Visit: Payer: Self-pay

## 2015-03-05 DIAGNOSIS — Z1231 Encounter for screening mammogram for malignant neoplasm of breast: Secondary | ICD-10-CM

## 2015-03-25 ENCOUNTER — Ambulatory Visit (AMBULATORY_SURGERY_CENTER): Payer: Self-pay | Admitting: *Deleted

## 2015-03-25 VITALS — Ht 68.0 in | Wt 197.0 lb

## 2015-03-25 DIAGNOSIS — Z1211 Encounter for screening for malignant neoplasm of colon: Secondary | ICD-10-CM

## 2015-03-25 MED ORDER — NA SULFATE-K SULFATE-MG SULF 17.5-3.13-1.6 GM/177ML PO SOLN: 1.0000 | Freq: Once | ORAL | Status: DC

## 2015-03-25 NOTE — Progress Notes (Signed)
No egg or soy allergy. No anesthesia problems.  No home O2.  No diet meds.  

## 2015-04-08 ENCOUNTER — Ambulatory Visit (AMBULATORY_SURGERY_CENTER): Payer: BC Managed Care – PPO | Admitting: Gastroenterology

## 2015-04-08 ENCOUNTER — Encounter: Payer: Self-pay | Admitting: Gastroenterology

## 2015-04-08 VITALS — BP 137/73 | HR 61 | Temp 97.3°F | Resp 16 | Ht 68.0 in | Wt 197.0 lb

## 2015-04-08 DIAGNOSIS — Z1211 Encounter for screening for malignant neoplasm of colon: Secondary | ICD-10-CM | POA: Diagnosis present

## 2015-04-08 DIAGNOSIS — K648 Other hemorrhoids: Secondary | ICD-10-CM

## 2015-04-08 MED ORDER — SODIUM CHLORIDE 0.9 % IV SOLN: 500.0000 mL | INTRAVENOUS | Status: DC

## 2015-04-08 NOTE — Op Note (Signed)
[facility] [address] [place], [address]   COLONOSCOPY PROCEDURE REPORT  PATIENT: Brandi Diaz, Brandi Diaz  MR#: [id number] BIRTHDATE: [birth year] , 53  yrs. old GENDER: female ENDOSCOPIST: Louis Meckel, MD REFERRED XB:[id number] Todd, MD PROCEDURE DATE:  04/08/2015 PROCEDURE:   Colonoscopy, screening First Screening Colonoscopy - Avg.  risk and is 50 yrs.  old or older Yes.  Prior Negative Screening - Now for repeat screening. N/A  History of Adenoma - Now for follow-up colonoscopy & has been > or = to 3 yrs.  N/A  Polyps removed today? No Recommend repeat exam, <10 yrs? No ASA CLASS:   Class II INDICATIONS:Colorectal Neoplasm Risk Assessment for this procedure is average risk. MEDICATIONS: Monitored anesthesia care and Propofol 200 mg IV  DESCRIPTION OF PROCEDURE:   After the risks benefits and alternatives of the procedure were thoroughly explained, informed consent was obtained.  The digital rectal exam revealed no abnormalities of the rectum.   The LB [id number]  endoscope was introduced through the anus and advanced to the terminal ileum which was intubated for a short distance. No adverse events experienced.   The quality of the prep was (Suprep was used) excellent.  The instrument was then slowly withdrawn as the colon was fully examined. Estimated blood loss is zero unless otherwise noted in this procedure report.      COLON FINDINGS: Internal hemorrhoids were found.   The examination was otherwise normal.  Retroflexed views revealed no abnormalities. The time to cecum = 4.5 Withdrawal time = 6.8   The scope was withdrawn and the procedure completed. COMPLICATIONS: There were no immediate complications.  ENDOSCOPIC IMPRESSION: 1.   Internal hemorrhoids 2.   The examination was otherwise normal  RECOMMENDATIONS: Continue current colorectal screening recommendations for "routine risk" patients with a repeat colonoscopy in 10 years.  eSigned:   Louis Meckel, MD 04/08/2015 10:27 AM   cc:

## 2015-04-08 NOTE — Progress Notes (Signed)
Report to PACU, RN, vss, BBS= Clear.  

## 2015-04-08 NOTE — Patient Instructions (Signed)
YOU HAD AN ENDOSCOPIC PROCEDURE TODAY AT THE [facility]:   Refer to the procedure report that was given to you for any specific questions about what was found during the examination.  If the procedure report does not answer your questions, please call your gastroenterologist to clarify.  If you requested that your care partner not be given the details of your procedure findings, then the procedure report has been included in a sealed envelope for you to review at your convenience later.  YOU SHOULD EXPECT: Some feelings of bloating in the abdomen. Passage of more gas than usual.  Walking can help get rid of the air that was put into your GI tract during the procedure and reduce the bloating. If you had a lower endoscopy (such as a colonoscopy or flexible sigmoidoscopy) you may notice spotting of blood in your stool or on the toilet paper. If you underwent a bowel prep for your procedure, you may not have a normal bowel movement for a few days.  Please Note:  You might notice some irritation and congestion in your nose or some drainage.  This is from the oxygen used during your procedure.  There is no need for concern and it should clear up in a day or so.  SYMPTOMS TO REPORT IMMEDIATELY:   Following lower endoscopy (colonoscopy or flexible sigmoidoscopy):  Excessive amounts of blood in the stool  Significant tenderness or worsening of abdominal pains  Swelling of the abdomen that is new, acute  Fever of 100F or higher   For urgent or emergent issues, a gastroenterologist can be reached at any hour by calling [phone].   DIET: Your first meal following the procedure should be a small meal and then it is ok to progress to your normal diet. Heavy or fried foods are harder to digest and may make you feel nauseous or bloated.  Likewise, meals heavy in dairy and vegetables can increase bloating.  Drink plenty of fluids but you should avoid alcoholic beverages for 24  hours.  ACTIVITY:  You should plan to take it easy for the rest of today and you should NOT DRIVE or use heavy machinery until tomorrow (because of the sedation medicines used during the test).    FOLLOW UP: Our staff will call the number listed on your records the next business day following your procedure to check on you and address any questions or concerns that you may have regarding the information given to you following your procedure. If we do not reach you, we will leave a message.  However, if you are feeling well and you are not experiencing any problems, there is no need to return our call.  We will assume that you have returned to your regular daily activities without incident.  If any biopsies were taken you will be contacted by phone or by letter within the next 1-3 weeks.  Please call us at [phone] if you have not heard about the biopsies in 3 weeks.    SIGNATURES/CONFIDENTIALITY: You and/or your care partner have signed paperwork which will be entered into your electronic medical record.  These signatures attest to the fact that that the information above on your After Visit Summary has been reviewed and is understood.  Full responsibility of the confidentiality of this discharge information lies with you and/or your care-partner. 

## 2015-04-09 ENCOUNTER — Telehealth: Payer: Self-pay

## 2015-04-09 NOTE — Telephone Encounter (Signed)
  Follow up Call-  Call back number 04/08/2015  Post procedure Call Back phone  # [phone]  Permission to leave phone message Yes     Patient questions:  Do you have a fever, pain , or abdominal swelling? No. Pain Score  0 *  Have you tolerated food without any problems? Yes.    Have you been able to return to your normal activities? Yes.    Do you have any questions about your discharge instructions: Diet   No. Medications  No. Follow up visit  No.  Do you have questions or concerns about your Care? No.  Actions: * If pain score is 4 or above: No action needed, pain <4.

## 2015-04-16 ENCOUNTER — Ambulatory Visit
Admission: RE | Admit: 2015-04-16 | Discharge: 2015-04-16 | Disposition: A | Discharge status: Home / Self Care | Payer: BC Managed Care – PPO | Source: Ambulatory Visit

## 2015-04-16 DIAGNOSIS — Z1231 Encounter for screening mammogram for malignant neoplasm of breast: Secondary | ICD-10-CM

## 2015-04-29 ENCOUNTER — Ambulatory Visit: Payer: BC Managed Care – PPO | Admitting: Adult Health

## 2016-02-17 ENCOUNTER — Other Ambulatory Visit: Payer: BC Managed Care – PPO

## 2016-02-26 ENCOUNTER — Encounter: Payer: Self-pay | Admitting: Family Medicine

## 2016-02-26 ENCOUNTER — Ambulatory Visit (INDEPENDENT_AMBULATORY_CARE_PROVIDER_SITE_OTHER): Payer: BC Managed Care – PPO | Admitting: Family Medicine

## 2016-02-26 VITALS — BP 100/60 | HR 64 | Temp 97.9°F | Resp 12 | Ht 68.0 in | Wt 195.6 lb

## 2016-02-26 DIAGNOSIS — Z114 Encounter for screening for human immunodeficiency virus [HIV]: Secondary | ICD-10-CM

## 2016-02-26 DIAGNOSIS — R1013 Epigastric pain: Secondary | ICD-10-CM

## 2016-02-26 DIAGNOSIS — Z1159 Encounter for screening for other viral diseases: Secondary | ICD-10-CM

## 2016-02-26 DIAGNOSIS — Z0000 Encounter for general adult medical examination without abnormal findings: Secondary | ICD-10-CM

## 2016-02-26 DIAGNOSIS — R112 Nausea with vomiting, unspecified: Secondary | ICD-10-CM

## 2016-02-26 DIAGNOSIS — E782 Mixed hyperlipidemia: Secondary | ICD-10-CM

## 2016-02-26 LAB — LIPID PANEL
CHOL/HDL RATIO: 4
Cholesterol: 221 mg/dL — ABNORMAL HIGH (ref 0–200)
HDL: 52.5 mg/dL (ref >39.00)
LDL CALC: 145 mg/dL — AB (ref 0–99)
NONHDL: 168.47
TRIGLYCERIDES: 119 mg/dL (ref 0.0–149.0)
VLDL: 23.8 mg/dL (ref 0.0–40.0)

## 2016-02-26 LAB — COMPREHENSIVE METABOLIC PANEL
ALT: 22 U/L (ref 0–35)
AST: 20 U/L (ref 0–37)
Albumin: 3.4 g/dL — ABNORMAL LOW (ref 3.5–5.2)
Alkaline Phosphatase: 34 U/L — ABNORMAL LOW (ref 39–117)
BILIRUBIN TOTAL: 0.4 mg/dL (ref 0.2–1.2)
BUN: 6 mg/dL (ref 6–23)
CALCIUM: 8.8 mg/dL (ref 8.4–10.5)
CO2: 28 meq/L (ref 19–32)
Chloride: 107 mEq/L (ref 96–112)
Creatinine, Ser: 0.66 mg/dL (ref 0.40–1.20)
GFR: 120.64 mL/min (ref >60.00)
GLUCOSE: 91 mg/dL (ref 70–99)
POTASSIUM: 4.1 meq/L (ref 3.5–5.1)
Sodium: 138 mEq/L (ref 135–145)
Total Protein: 5.7 g/dL — ABNORMAL LOW (ref 6.0–8.3)

## 2016-02-26 LAB — CBC
HCT: 38.8 % (ref 36.0–46.0)
HEMOGLOBIN: 12.7 g/dL (ref 12.0–15.0)
MCHC: 32.7 g/dL (ref 30.0–36.0)
MCV: 83.9 fl (ref 78.0–100.0)
PLATELETS: 173 10*3/uL (ref 150.0–400.0)
RBC: 4.62 Mil/uL (ref 3.87–5.11)
RDW: 14.2 % (ref 11.5–15.5)
WBC: 3.8 10*3/uL — ABNORMAL LOW (ref 4.0–10.5)

## 2016-02-26 MED ORDER — OMEPRAZOLE 40 MG PO CPDR
40.0000 mg | DELAYED_RELEASE_CAPSULE | Freq: Every day | ORAL | Status: DC
Start: 2016-02-26 — End: 2019-05-29

## 2016-02-26 NOTE — Progress Notes (Signed)
HPI:   Brandi Diaz is a 55 y.o. female, who is here today to establish care with me and for her routine physical.   She does exercise regularly and does follow a healthy diet.  She lives with mother.  Chronic medical problems: she was on Fe Sulfate before but not sure about anemia.  Mild HLD managed with diet.  Lab Results  Component Value Date   CHOL 230* 01/15/2014   HDL 53.40 01/15/2014   LDLCALC 150* 01/15/2014   LDLDIRECT 203.4 12/18/2012   TRIG 135.0 01/15/2014   CHOLHDL 4 01/15/2014   Lab Results  Component Value Date   WBC 4.2 01/15/2014   HGB 12.7 01/15/2014   HCT 38.7 01/15/2014   MCV 83.4 01/15/2014   PLT 181.0 01/15/2014     Pap smear: s/p hysterectomy 2002. Hx of abnormal pap smears: No. Hx of STD's: Denies.  FHx for gynecologic or colon cancer:Negative.  She has a concerns today:  Nausea for over a year she has had almost daily nausea with the first bite of her food, any kind of food and at any time. stable. She has not identified trigger factors and resolved with stopping eating for a few minutes. Occasionally she vomits, denies dysphagia, heartburn. + Burping.  Denies abdominal pain,changes in bowel habits, blood in stool or melena.     Chemistry      Component Value Date/Time   NA 140 01/15/2014 0929   K 4.2 01/15/2014 0929   CL 109 01/15/2014 0929   CO2 28 01/15/2014 0929   BUN 8 01/15/2014 0929   CREATININE 0.6 01/15/2014 0929      Component Value Date/Time   CALCIUM 8.5 01/15/2014 0929   ALKPHOS 36* 01/15/2014 0929   AST 28 01/15/2014 0929   ALT 30 01/15/2014 0929   BILITOT 0.5 01/15/2014 0929      Colonoscopy in 04/2015. Mammogram 04/2015.  No smoker. Denies high alcohol intake or stress.  Interested in HIV and HCV screening.    Review of Systems  Constitutional: Negative for fever, activity change, appetite change, fatigue and unexpected weight change.  HENT: Negative for dental problem, hearing loss, mouth  sores, trouble swallowing and voice change.   Eyes: Negative for photophobia, pain and visual disturbance.  Respiratory: Negative for cough, shortness of breath and wheezing.   Cardiovascular: Negative for chest pain and leg swelling.  Gastrointestinal: Positive for nausea and vomiting. Negative for abdominal pain.       No changes in bowel habits.  Endocrine: Negative for cold intolerance and heat intolerance.  Genitourinary: Negative for dysuria, hematuria, decreased urine volume, vaginal bleeding and vaginal discharge.       Denies sexual activity.  Musculoskeletal: Negative for back pain, arthralgias and neck pain.  Skin: Negative for color change and rash.  Neurological: Negative for seizures, syncope, weakness, numbness and headaches.  Hematological: Negative for adenopathy. Does not bruise/bleed easily.  Psychiatric/Behavioral: Negative for confusion and sleep disturbance. The patient is not nervous/anxious.   All other systems reviewed and are negative.     Current Outpatient Prescriptions on File Prior to Visit  Medication Sig Dispense Refill  . ferrous sulfate 325 (65 FE) MG tablet Take 325 mg by mouth daily with breakfast.    . Multiple Vitamins-Minerals (MULTIVITAMIN ADULT PO) Take by mouth.     No current facility-administered medications on file prior to visit.     Past Medical History  Diagnosis Date  . Anemia  No Known Allergies  Family History  Problem Relation Age of Onset  . Diabetes Other   . Hypertension Other   . Colon cancer Neg Hx   . Diabetes Mother     Social History   Social History  . Marital Status: Single    Spouse Name: N/A  . Number of Children: N/A  . Years of Education: N/A   Social History Main Topics  . Smoking status: Never Smoker   . Smokeless tobacco: Never Used  . Alcohol Use: No  . Drug Use: No  . Sexual Activity: Not Asked   Other Topics Concern  . None   Social History Narrative     Filed Vitals:    02/26/16 0751  BP: 100/60  Pulse: 64  Temp: 97.9 F (36.6 C)  Resp: 12   Body mass index is 29.74 kg/(m^2).  SpO2 Readings from Last 3 Encounters:  02/26/16 98%  04/08/15 96%  08/18/12 98%    Wt Readings from Last 3 Encounters:  02/26/16 195 lb 9 oz (88.707 kg)  04/08/15 197 lb (89.359 kg)  03/25/15 197 lb (89.359 kg)    Physical Exam  Constitutional: She is oriented to person, place, and time. She appears well-developed. No distress.  HENT:  Head: Atraumatic.  Right Ear: Hearing, tympanic membrane, external ear and ear canal normal.  Left Ear: Hearing, tympanic membrane, external ear and ear canal normal.  Mouth/Throat: Uvula is midline, oropharynx is clear and moist and mucous membranes are normal.  Eyes: Conjunctivae and EOM are normal. Pupils are equal, round, and reactive to light.  Small pinguecula left nasal conjunctiva.  Neck: No JVD present. No tracheal deviation present. No thyromegaly present.  Cardiovascular: Normal rate and regular rhythm.   No murmur heard. Pulses:      Dorsalis pedis pulses are 2+ on the right side, and 2+ on the left side.       Posterior tibial pulses are 2+ on the right side, and 2+ on the left side.  Respiratory: Effort normal and breath sounds normal. No respiratory distress.  GI: Soft. She exhibits no mass. There is no hepatomegaly. There is no tenderness.  Genitourinary: No breast swelling, tenderness, discharge or bleeding.  Mild fibrocystic changes right outer upper quadrant.  Musculoskeletal: She exhibits no edema.  No major deformity or sing of synovitis appreciated.  Lymphadenopathy:    She has no cervical adenopathy.       Right: No supraclavicular adenopathy present.       Left: No supraclavicular adenopathy present.  Neurological: She is alert and oriented to person, place, and time. She has normal strength. No cranial nerve deficit. Coordination and gait normal.  Reflex Scores:      Bicep reflexes are 2+ on the right side  and 2+ on the left side.      Patellar reflexes are 2+ on the right side and 2+ on the left side. Skin: Skin is warm. No rash noted. No erythema.  No suspicious lesions appreciated, she has a few hypertrophic scars.  Psychiatric: Her speech is normal. Her mood appears not anxious.  Well groomed, good eye contact.      ASSESSMENT AND PLAN:     [name] was seen today for transfer from [facility] and for CPE.  Diagnoses and all orders for this visit:   1. Routine general medical examination at a health care facility   We discussed the importance of regular physical activity and healthy diet for prevention of chronic illness and/or complications.  Preventive guidelines reviewed. Some general recommendations about vaccination and multivitamins benefits also reviewed. Next CPE in 1 year.   - Lipid panel - Comprehensive metabolic panel - CBC - HIV antibody (with reflex) - Hep C Antibody  2. Mixed hyperlipidemia  Continue nonpharmacologic treatment. Further recommendations would be given if needed according to lab results. Follow-up in one year.  - Lipid panel  3. Non-intractable vomiting with nausea, unspecified vomiting type  We discussed possible causes of nausea/vomiting, she agrees to try PPI medication, some side effects discussed. She needs to follow if she starts with a new symptom or if problem gets worse. I recommended signing for my chart, so she can let me know in about 3-4 weeks how is nausea after starting medication. We may consider a right upper quadrant ultrasound to evaluate for gall bladder disease even though she is not having any other associated symptoms.   - omeprazole (PRILOSEC) 40 MG capsule; Take 1 capsule (40 mg total) by mouth daily before breakfast.  Dispense: 30 capsule; Refill: 1  4. Dyspepsia  GERD precautions recommended. Smaller and more frequent meals, avoid food that can irritate GI system. She will try omeprazole for 2-3 months.  -  omeprazole (PRILOSEC) 40 MG capsule; Take 1 capsule (40 mg total) by mouth daily before breakfast.  Dispense: 30 capsule; Refill: 1  5. Need for hepatitis C screening test 6. Screening for HIV (human immunodeficiency virus)  No risk factors identified but after education about HCV she would like screening done as well as HIV.     I will see her back in a year for her routine physical, she was instructed to follow-up sooner if new concerns arise, she voices understanding.   Betty G. Swazil[place]Jordan, MD  [facility]. [place] office.

## 2016-02-26 NOTE — Patient Instructions (Signed)
A few things to remember from today's visit:   1. Routine general medical examination at a health care facility  - Lipid panel - Comprehensive metabolic panel - CBC - HIV antibody (with reflex) - Hep C Antibody  2. Mixed hyperlipidemia  - Lipid panel  3. Non-intractable vomiting with nausea, unspecified vomiting type  - omeprazole (PRILOSEC) 40 MG capsule; Take 1 capsule (40 mg total) by mouth daily before breakfast.  Dispense: 30 capsule; Refill: 1  4. Dyspepsia  - omeprazole (PRILOSEC) 40 MG capsule; Take 1 capsule (40 mg total) by mouth daily before breakfast.  Dispense: 30 capsule; Refill: 1  5. Need for hepatitis C screening test   6. Screening for HIV (human immunodeficiency virus)      At least 150 minutes of moderate exercise per week, daily brisk walking for 15-30 min is a good exercise option. Healthy diet low in saturated (animal) fats and sweets and consisting of fresh fruits and vegetables, lean meats such as fish and white chicken and whole grains.   - Vaccines:  Tdap vaccine every 10 years.  Shingles vaccine recommended at age 53, could be given after 53 years of age but not sure about insurance coverage.  Pneumonia vaccines:  Prevnar 13 at 65 and Pneumovax at 66.  Screening recommendations for low/normal risk women:  Screening for diabetes at age 53-45 and every 3 years.  Cervical cancer prevention:  Hysterectomy.   -Breast cancer: Mammogram: There is disagreement between experts about when to start screening in low risk asymptomatic female (40-45-50 years). > 53 years old every 2 years.   Colon cancer screening: starts at 53 years old until 53 years old.        If you sign-up for My chart, you can communicate easier with us in case you have any question or concern.

## 2016-02-26 NOTE — Progress Notes (Signed)
Pre visit review using our clinic review tool, if applicable. No additional management support is needed unless otherwise documented below in the visit note. 

## 2016-02-27 LAB — HIV ANTIBODY (ROUTINE TESTING W REFLEX): HIV 1&2 Ab, 4th Generation: NONREACTIVE

## 2016-02-27 LAB — HEPATITIS C ANTIBODY: HCV AB: NEGATIVE

## 2016-02-29 ENCOUNTER — Encounter: Payer: BC Managed Care – PPO | Admitting: Family Medicine

## 2016-04-08 ENCOUNTER — Other Ambulatory Visit: Payer: Self-pay | Admitting: Family Medicine

## 2016-04-08 DIAGNOSIS — Z1231 Encounter for screening mammogram for malignant neoplasm of breast: Secondary | ICD-10-CM

## 2016-04-19 ENCOUNTER — Ambulatory Visit
Admission: RE | Admit: 2016-04-19 | Discharge: 2016-04-19 | Disposition: A | Discharge status: Home / Self Care | Payer: BC Managed Care – PPO | Source: Ambulatory Visit | Attending: Family Medicine | Admitting: Family Medicine

## 2016-04-19 DIAGNOSIS — Z1231 Encounter for screening mammogram for malignant neoplasm of breast: Secondary | ICD-10-CM

## 2016-05-15 IMAGING — MG MM SCREEN MAMMOGRAM BILATERAL
4 series · 4 of 4 positions shown · non-contrast
Comparison: Previous exam(s).

CLINICAL DATA: Screening.

EXAM:
DIGITAL SCREENING BILATERAL MAMMOGRAM WITH CAD

[R CC]
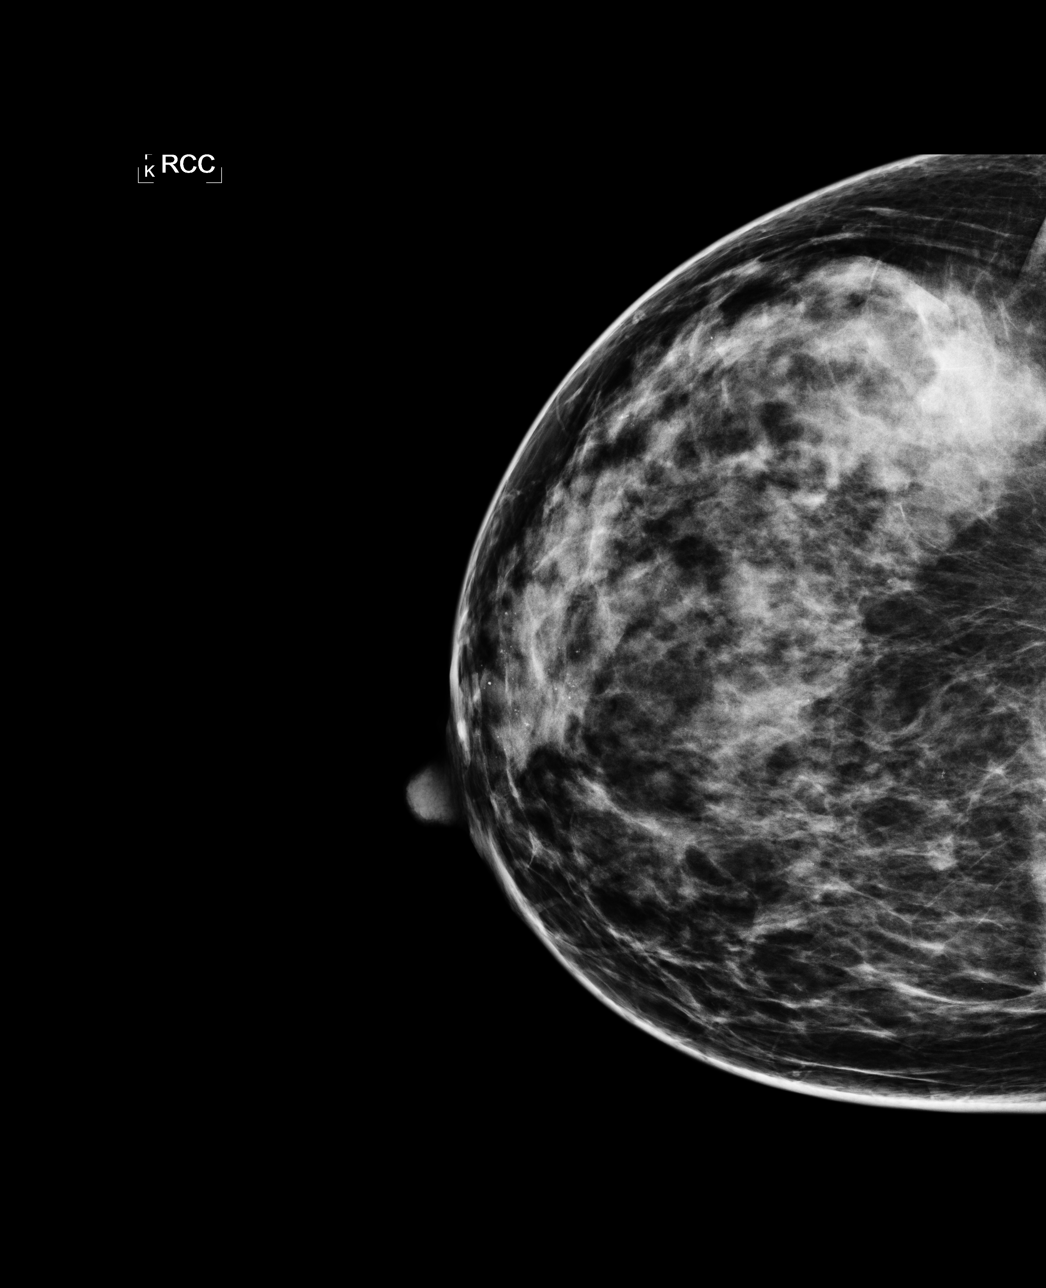

[L CC]
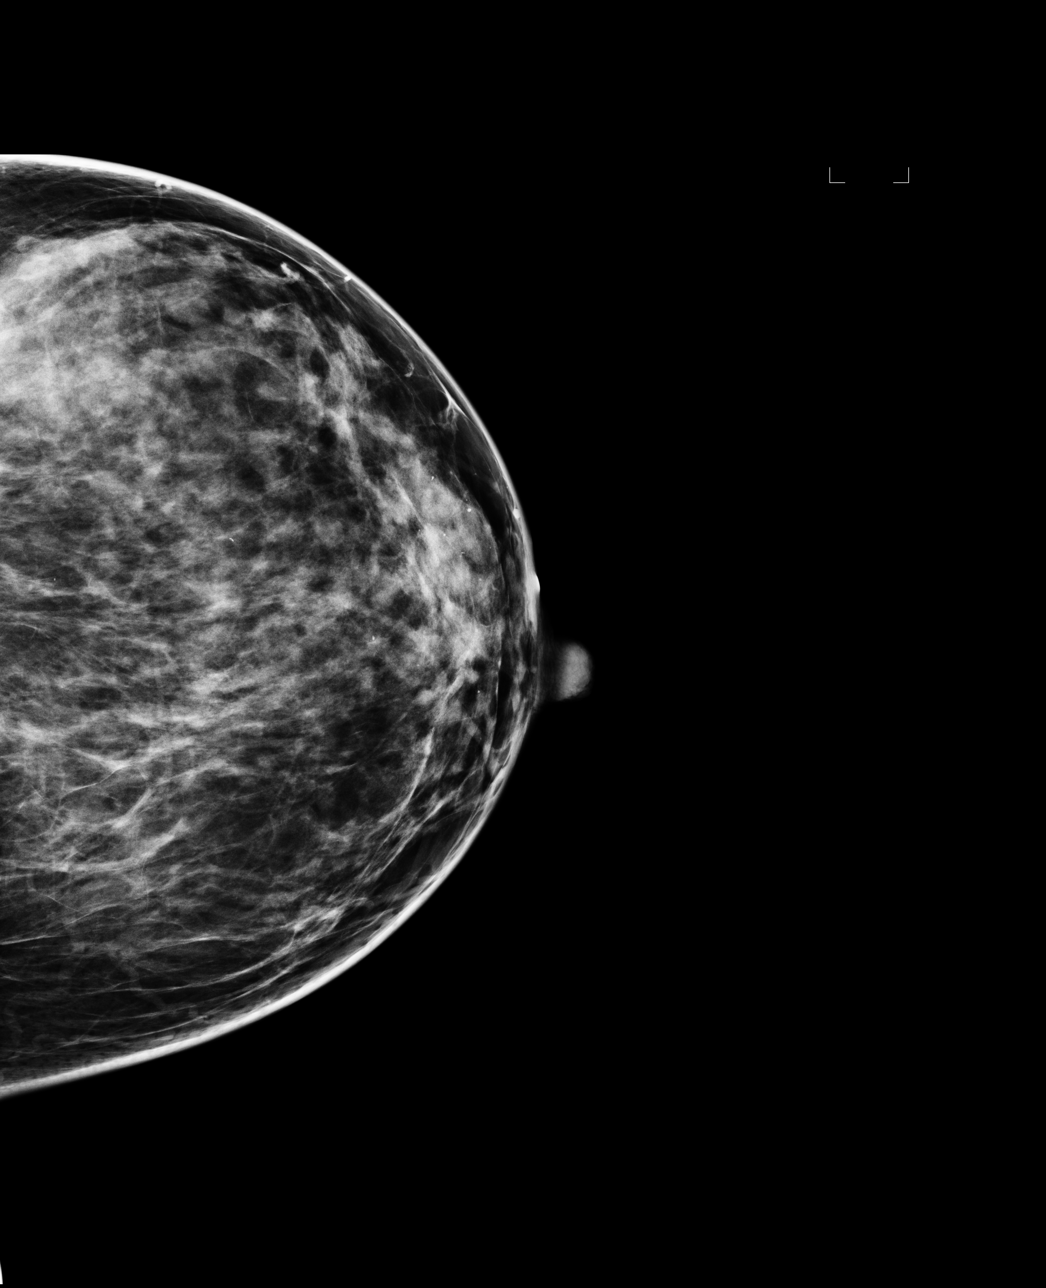

[L MLO]
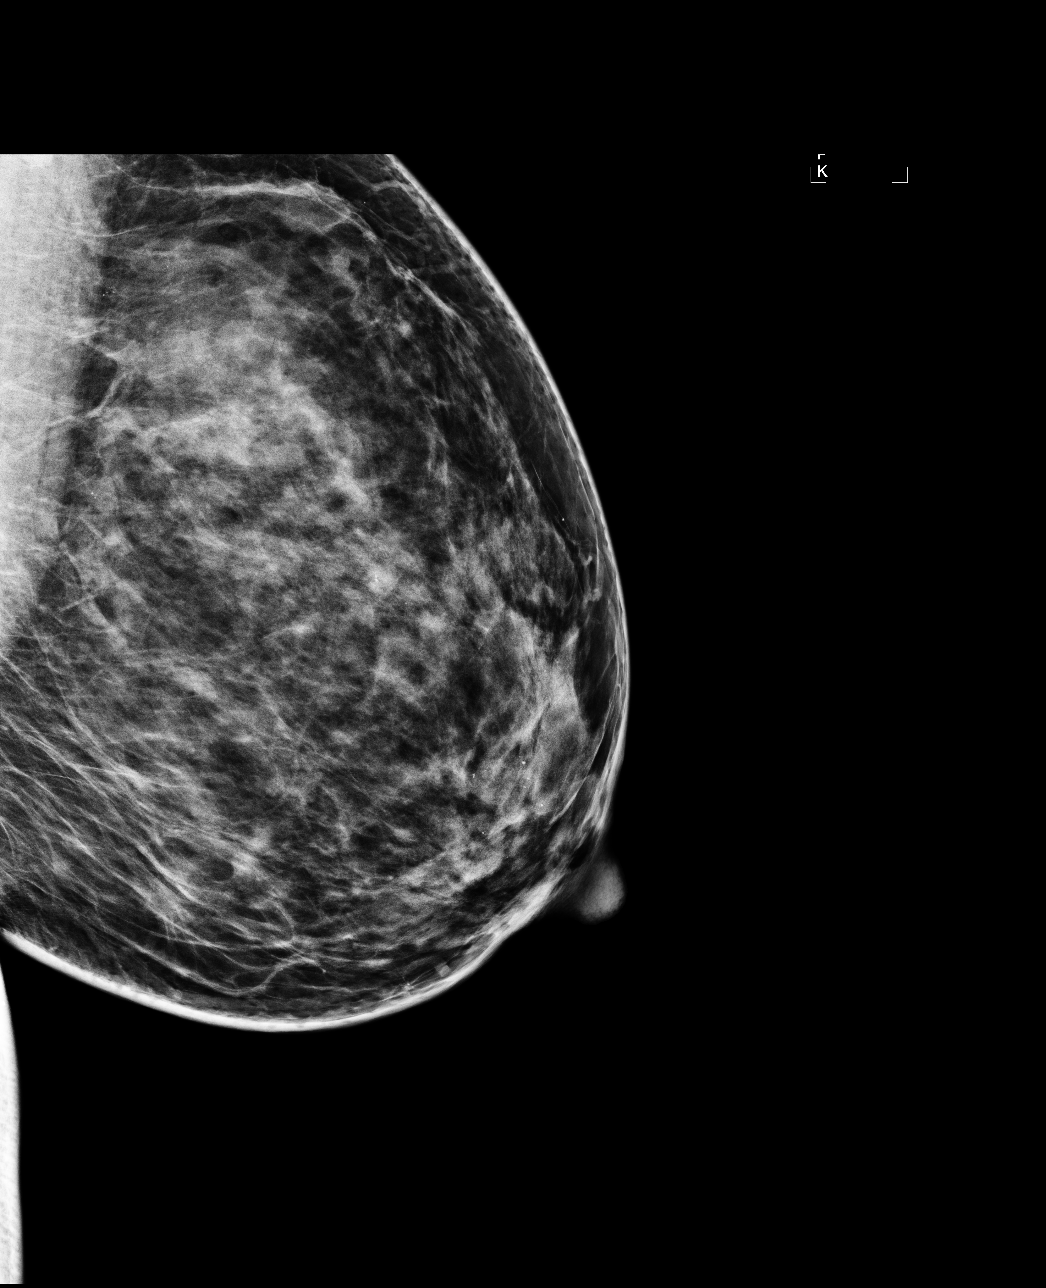

[R MLO]
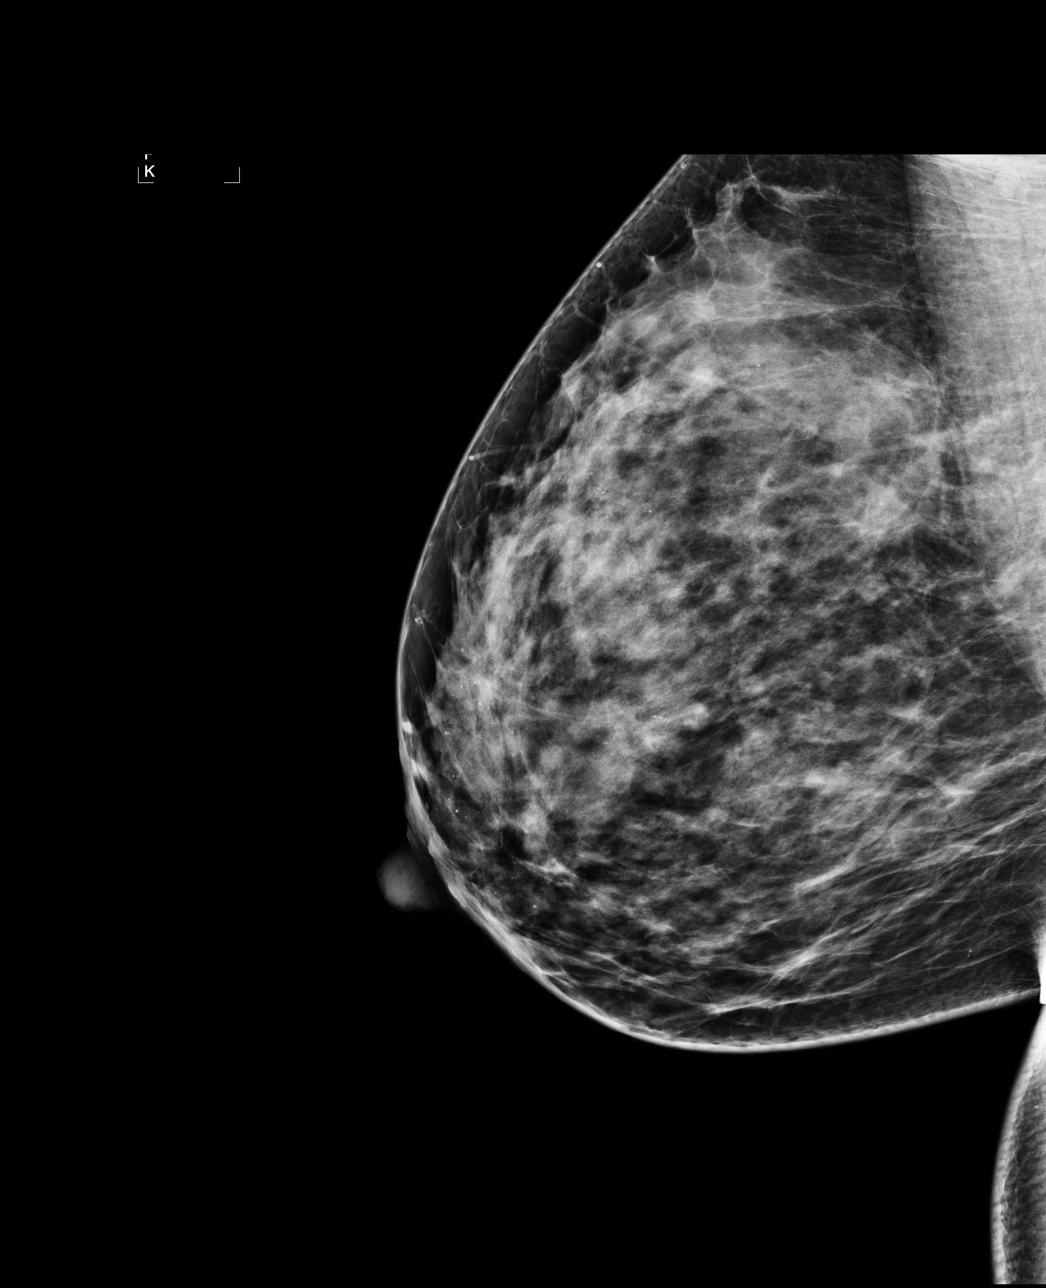

[4 of 4 positions shown; findings below may reference images not displayed]

ACR Breast Density Category c: The breast tissue is heterogeneously
dense, which may obscure small masses.
FINDINGS: There are no findings suspicious for malignancy. Images were
processed with CAD.
IMPRESSION: No mammographic evidence of malignancy. A result letter of this
screening mammogram will be mailed directly to the patient.

RECOMMENDATION:
Screening mammogram in one year. (Code:[id number])

BI-RADS CATEGORY  1: Negative.

## 2017-02-26 NOTE — Progress Notes (Signed)
HPI:   Brandi Diaz is a 56 y.o. female, who is here today for her routine physical.  Last CPE on 02/25/17.  Regular exercise 3 or more time per week: Yes, she goes to the gyn 2 times per week and has an active job. Following a healthy diet: Yes  She lives with her mother, who is mostly independent but dealing with chronic illness.   Chronic medical problems: HLD  Lab Results  Component Value Date   CHOL 221 (H) 02/26/2016   HDL 52.50 02/26/2016   LDLCALC 145 (H) 02/26/2016   LDLDIRECT 203.4 12/18/2012   TRIG 119.0 02/26/2016   CHOLHDL 4 02/26/2016    S/P hysterectomy in 2002 due to heavy menses. Hx of abnormal pap smears: Denies. Hx of STD's: Denies. Last eye exam this year.  Mammogram: 04/2016 [facility] Colonoscopy: 04/2015  Immunization History  Administered Date(s) Administered  . Influenza Whole 08/14/2007, 06/05/2009  . Influenza,inj,Quad PF,36+ Mos 06/26/2016  . Td 09/05/2001  . Tdap 01/05/2012    Hep C screening: Negative on 02/26/16.  New concerns today:  Vaginal discharge noted about 3 weeks ago. "Thick" whitish. She denies pruritus or odor. Denies vaginal bleeding. She is not sexually active.  She has not had fever,chills, skin rash, abdominal pain, nausea,vomiting, or urinary symptoms. She has not used OTC medications.   Review of Systems  Constitutional: Negative for appetite change, fatigue, fever and unexpected weight change.  HENT: Negative for dental problem, hearing loss, mouth sores, sore throat, trouble swallowing and voice change.   Eyes: Negative for redness and visual disturbance.  Respiratory: Negative for cough, shortness of breath and wheezing.   Cardiovascular: Negative for chest pain and leg swelling.  Gastrointestinal: Negative for abdominal pain, blood in stool, nausea and vomiting.       No changes in bowel habits.  Endocrine: Negative for cold intolerance, heat intolerance, polydipsia, polyphagia and polyuria.    Genitourinary: Positive for vaginal discharge. Negative for decreased urine volume, dysuria, hematuria and vaginal bleeding.  Musculoskeletal: Negative for gait problem and myalgias.  Skin: Negative for color change and rash.  Neurological: Negative for syncope, weakness and headaches.  Hematological: Negative for adenopathy. Does not bruise/bleed easily.  Psychiatric/Behavioral: Negative for confusion and sleep disturbance. The patient is not nervous/anxious.   All other systems reviewed and are negative.     Current Outpatient Prescriptions on File Prior to Visit  Medication Sig Dispense Refill  . ferrous sulfate 325 (65 FE) MG tablet Take 325 mg by mouth daily with breakfast.    . Multiple Vitamins-Minerals (MULTIVITAMIN ADULT PO) Take by mouth.    Marland Kitchen omeprazole (PRILOSEC) 40 MG capsule Take 1 capsule (40 mg total) by mouth daily before breakfast. 30 capsule 1   No current facility-administered medications on file prior to visit.      Past Medical History:  Diagnosis Date  . Anemia    Past Surgical History:  Procedure Laterality Date  . CESAREAN SECTION  x3  . HEMORRHOID SURGERY    . TOTAL ABDOMINAL HYSTERECTOMY     nonmalignant reasons - ovaries intact  . TUBAL LIGATION       No Known Allergies  Family History  Problem Relation Age of Onset  . Diabetes Other   . Hypertension Other   . Diabetes Mother   . Colon cancer Neg Hx     Social History   Social History  . Marital status: Single    Spouse name: N/A  .  Number of children: N/A  . Years of education: N/A   Social History Main Topics  . Smoking status: Never Smoker  . Smokeless tobacco: Never Used  . Alcohol use No  . Drug use: No  . Sexual activity: Not Currently   Other Topics Concern  . None   Social History Narrative  . None     Vitals:   02/27/17 0802  BP: 104/70  Pulse: 67  Resp: 12   Body mass index is 30.75 kg/m.  O2 sat at RA 97%  Wt Readings from Last 3 Encounters:   02/27/17 202 lb 4 oz (91.7 kg)  02/26/16 195 lb 9 oz (88.7 kg)  04/08/15 197 lb (89.4 kg)    Physical Exam  Nursing note and vitals reviewed. Constitutional: She is oriented to person, place, and time. She appears well-developed. No distress.  HENT:  Head: Atraumatic.  Right Ear: Hearing, tympanic membrane, external ear and ear canal normal.  Left Ear: Hearing, tympanic membrane, external ear and ear canal normal.  Mouth/Throat: Uvula is midline, oropharynx is clear and moist and mucous membranes are normal.  Eyes: Conjunctivae and EOM are normal. Pupils are equal, round, and reactive to light.  Neck: No tracheal deviation present. No thyromegaly (palpable) present.  Cardiovascular: Normal rate and regular rhythm.   No murmur heard. Pulses:      Dorsalis pedis pulses are 2+ on the right side, and 2+ on the left side.  Respiratory: Effort normal and breath sounds normal. No respiratory distress.  GI: Soft. She exhibits no mass. There is no hepatomegaly. There is no tenderness.  Genitourinary: No breast swelling or tenderness. There is no tenderness or lesion on the right labia. There is no tenderness or lesion on the left labia. Right adnexum displays no mass and no tenderness. Left adnexum displays no mass and no tenderness. No erythema, tenderness or bleeding in the vagina. No foreign body in the vagina. Vaginal discharge found.  Genitourinary Comments: Breast: No masses, skin changes, or nipple discharge bilateral. Cervix and uterus absent. Whitish , non odorous, cotte cheese appearance.  Wet prep sample collected.    Musculoskeletal: She exhibits edema (1+ pitting LE edema, bilateral).  No major deformity or signs of synovitis appreciated.  Lymphadenopathy:    She has no cervical adenopathy.       Right: No inguinal and no supraclavicular adenopathy present.       Left: No inguinal and no supraclavicular adenopathy present.  Neurological: She is alert and oriented to person,  place, and time. She has normal strength. No cranial nerve deficit. Coordination and gait normal.  Reflex Scores:      Bicep reflexes are 2+ on the right side and 2+ on the left side.      Patellar reflexes are 2+ on the right side and 2+ on the left side. Skin: Skin is warm. No rash noted. No erythema.  Psychiatric: She has a normal mood and affect. Her speech is normal. Cognition and memory are normal.  Well groomed, good eye contact.    ASSESSMENT AND PLAN:   Ms [name] was seen today for annual exam.  Diagnoses and all orders for this visit:  Lab Results  Component Value Date   CREATININE 0.70 02/27/2017   BUN 10 02/27/2017   NA 142 02/27/2017   K 4.0 02/27/2017   CL 109 02/27/2017   CO2 28 02/27/2017   Lab Results  Component Value Date   CHOL 252 (H) 02/27/2017   HDL 61.70  02/27/2017   LDLCALC 167 (H) 02/27/2017   LDLDIRECT 203.4 12/18/2012   TRIG 117.0 02/27/2017   CHOLHDL 4 02/27/2017    Routine general medical examination at a health care facility   We discussed the importance of regular physical activity and healthy diet for prevention of chronic illness and/or complications. Preventive guidelines reviewed. Vaccination up to date.  Ca++ and vit D supplementation recommended. Next CPE in 1 year.  -     Basic metabolic panel  Mixed hyperlipidemia  Continue non pharmacologic treatment, we will follow labs done today and will give further recommendations accordingly. F/U in 6-12 months.  -     Lipid panel -     TSH  Vaginal discharge  Clinical findings suggest candida vaginitis. Empiric treatment with oral and topical treatment started. Further recommendations will be given according to lab results.   -     fluconazole (DIFLUCAN) 150 MG tablet; Take 1 tablet (150 mg total) by mouth once. -     terconazole (TERAZOL 7) 0.4 % vaginal cream; Place 1 applicator vaginally at bedtime. -     WET PREP FOR TRICH, YEAST, CLUE; Future  -     WET PREP BY  MOLECULAR PROBE  Diabetes mellitus screening -     Basic metabolic panel     Return in 1 year (on 02/27/2018).        Betty G. [place], MD  [facility]. [place] office.

## 2017-02-27 ENCOUNTER — Ambulatory Visit (INDEPENDENT_AMBULATORY_CARE_PROVIDER_SITE_OTHER): Payer: BC Managed Care – PPO | Admitting: Family Medicine

## 2017-02-27 ENCOUNTER — Encounter: Payer: Self-pay | Admitting: Family Medicine

## 2017-02-27 VITALS — BP 104/70 | HR 67 | Resp 12 | Ht 68.0 in | Wt 202.2 lb

## 2017-02-27 DIAGNOSIS — Z131 Encounter for screening for diabetes mellitus: Secondary | ICD-10-CM | POA: Diagnosis not present

## 2017-02-27 DIAGNOSIS — E782 Mixed hyperlipidemia: Secondary | ICD-10-CM

## 2017-02-27 DIAGNOSIS — Z0000 Encounter for general adult medical examination without abnormal findings: Secondary | ICD-10-CM

## 2017-02-27 DIAGNOSIS — N898 Other specified noninflammatory disorders of vagina: Secondary | ICD-10-CM | POA: Diagnosis not present

## 2017-02-27 LAB — BASIC METABOLIC PANEL
BUN: 10 mg/dL (ref 6–23)
CHLORIDE: 109 meq/L (ref 96–112)
CO2: 28 mEq/L (ref 19–32)
Calcium: 8.9 mg/dL (ref 8.4–10.5)
Creatinine, Ser: 0.7 mg/dL (ref 0.40–1.20)
GFR: 112.29 mL/min (ref >60.00)
Glucose, Bld: 92 mg/dL (ref 70–99)
POTASSIUM: 4 meq/L (ref 3.5–5.1)
Sodium: 142 mEq/L (ref 135–145)

## 2017-02-27 LAB — LIPID PANEL
CHOLESTEROL: 252 mg/dL — AB (ref 0–200)
HDL: 61.7 mg/dL (ref >39.00)
LDL Cholesterol: 167 mg/dL — ABNORMAL HIGH (ref 0–99)
NonHDL: 190.16
TRIGLYCERIDES: 117 mg/dL (ref 0.0–149.0)
Total CHOL/HDL Ratio: 4
VLDL: 23.4 mg/dL (ref 0.0–40.0)

## 2017-02-27 LAB — TSH: TSH: 2.37 u[IU]/mL (ref 0.35–4.50)

## 2017-02-27 MED ORDER — FLUCONAZOLE 150 MG PO TABS
150.0000 mg | ORAL_TABLET | Freq: Once | ORAL | 1 refills | Status: AC
Start: 2017-02-27 — End: 2017-02-27

## 2017-02-27 MED ORDER — TERCONAZOLE 0.4 % VA CREA: 1.0000 | TOPICAL_CREAM | Freq: Every day | VAGINAL | 1 refills | Status: AC

## 2017-02-27 NOTE — Patient Instructions (Addendum)
A few things to remember from today's visit:   Mixed hyperlipidemia - Plan: Lipid panel, TSH  Vaginal discharge - Plan: fluconazole (DIFLUCAN) 150 MG tablet, terconazole (TERAZOL 7) 0.4 % vaginal cream  Diabetes mellitus screening - Plan: Basic metabolic panel  Routine general medical examination at a health care facility - Plan: Basic metabolic panel    At least 150 minutes of moderate exercise per week, daily brisk walking for 15-30 min is a good exercise option. Healthy diet low in saturated (animal) fats and sweets and consisting of fresh fruits and vegetables, lean meats such as fish and white chicken and whole grains.   - Vaccines:  Tdap vaccine every 10 years.  Shingles vaccine recommended at age 54, could be given after 54 years of age but not sure about insurance coverage.  Pneumonia vaccines:  Prevnar 13 at 65 and Pneumovax at 66.  Screening recommendations for low/normal risk women:  Screening for diabetes at age 740-45 and every 3 years.  Cervical cancer prevention:  -HPV vaccination between 869-54 years old. -Pap smear starts at 54 years of age and continues periodically until 54 years old in low risk women. Pap smear every 3 years between 4121 and 985 years old. Pap smear every 3 years between women 30 and older if pap smear negative and HPV screening negative.   -Breast cancer: Mammogram: There is disagreement between experts about when to start screening in low risk asymptomatic female but recent recommendations are to start screening at 6640 and not later than 54 years old , every 1-2 years and after 54 yo q 2 years. Screening is recommended until 54 years old but some women can continue screening depending of healthy issues.   Colon cancer screening: starts at 54 years old until 54 years old.  Cholesterol disorder screening at age 54 and every 3 years.  Also recommended:  1. Dental visit- Brush and floss your teeth twice daily; visit your dentist twice a  year. 2. Eye doctor- Get an eye exam at least every 2 years. 3. Helmet use- Always wear a helmet when riding a bicycle, motorcycle, rollerblading or skateboarding. 4. Safe sex- If you may be exposed to sexually transmitted infections, use a condom. 5. Seat belts- Seat belts can save your live; always wear one. 6. Smoke/Carbon Monoxide detectors- These detectors need to be installed on the appropriate level of your home. Replace batteries at least once a year. 7. Skin cancer- When out in the sun please cover up and use sunscreen 15 SPF or higher. 8. Violence- If anyone is threatening or hurting you, please tell your healthcare provider.  9. Drink alcohol in moderation- Limit alcohol intake to one drink or less per day. Never drink and drive. 10. En of life planing: Power of attorney and living will.   Please be sure medication list is accurate. If a new problem present, please set up appointment sooner than planned today.

## 2017-03-02 ENCOUNTER — Encounter: Payer: Self-pay | Admitting: Family Medicine

## 2017-03-03 LAB — WET PREP BY MOLECULAR PROBE
Candida species: DETECTED — AB
Gardnerella vaginalis: NOT DETECTED
TRICHOMONAS VAG: NOT DETECTED

## 2017-03-09 ENCOUNTER — Other Ambulatory Visit: Payer: Self-pay | Admitting: Family Medicine

## 2017-03-09 DIAGNOSIS — Z1231 Encounter for screening mammogram for malignant neoplasm of breast: Secondary | ICD-10-CM

## 2017-03-13 ENCOUNTER — Other Ambulatory Visit: Payer: Self-pay

## 2017-03-13 MED ORDER — SIMVASTATIN 20 MG PO TABS: 20.0000 mg | ORAL_TABLET | Freq: Every day | ORAL | 1 refills | Status: DC

## 2017-04-20 ENCOUNTER — Ambulatory Visit: Payer: BC Managed Care – PPO

## 2017-04-21 ENCOUNTER — Ambulatory Visit
Admission: RE | Admit: 2017-04-21 | Discharge: 2017-04-21 | Disposition: A | Discharge status: Home / Self Care | Payer: BC Managed Care – PPO | Source: Ambulatory Visit | Attending: Family Medicine | Admitting: Family Medicine

## 2017-04-21 DIAGNOSIS — Z1231 Encounter for screening mammogram for malignant neoplasm of breast: Secondary | ICD-10-CM

## 2017-07-26 ENCOUNTER — Encounter: Payer: Self-pay | Admitting: Family Medicine

## 2017-07-26 ENCOUNTER — Ambulatory Visit: Payer: BC Managed Care – PPO | Admitting: Family Medicine

## 2017-07-26 VITALS — BP 110/78 | HR 68 | Temp 98.5°F | Resp 12 | Ht 68.0 in | Wt 197.1 lb

## 2017-07-26 DIAGNOSIS — M79604 Pain in right leg: Secondary | ICD-10-CM

## 2017-07-26 MED ORDER — CELECOXIB 100 MG PO CAPS: 100.0000 mg | ORAL_CAPSULE | Freq: Two times a day (BID) | ORAL | 0 refills | Status: AC

## 2017-07-26 NOTE — Patient Instructions (Signed)
A few things to remember from today's visit:   Pain of right lower extremity - Plan: celecoxib (CELEBREX) 100 MG capsule  Rest leg for 2-3 weeks. Local icy hot and ice may help.   Please be sure medication list is accurate. If a new problem present, please set up appointment sooner than planned today.

## 2017-07-26 NOTE — Progress Notes (Signed)
ACUTE VISIT   HPI:  Chief Complaint  Patient presents with  . Right leg pain/swelling    [name] is a 56 y.o. female, who is here today with her daughter complaining of 3 days of right leg pain. Pain started after exercising on the treadmill, she does not remember having an injury while exercising. She noted localized swelling when pain is localized. Pressure like sensation, she can not quantify pain. Today it is "a lot of better."  She denies erythema, cold extremity, palpitation,dyspnea,chest pain, or fever. She has not travel recently, no surgeries. Pain is exacerbated by walking and standing. Alleviated by rest.   This happened again a few weeks ago, pain resolved after a few days.  Her daughter would like for her to see ortho.   Review of Systems  Constitutional: Negative for activity change, appetite change, fatigue and fever.  Respiratory: Negative for cough, shortness of breath and wheezing.   Cardiovascular: Negative for chest pain, palpitations and leg swelling.  Gastrointestinal: Negative for abdominal pain, nausea and vomiting.       Negative for changes in bowel habits or fecal incontinence.  Genitourinary: Negative for decreased urine volume, dysuria and hematuria.  Musculoskeletal: Positive for gait problem and myalgias. Negative for arthralgias, back pain and joint swelling.  Skin: Negative for pallor and rash.  Neurological: Negative for weakness and numbness.  Hematological: Negative for adenopathy. Does not bruise/bleed easily.  Psychiatric/Behavioral: Negative for confusion. The patient is nervous/anxious.       Current Outpatient Medications on File Prior to Visit  Medication Sig Dispense Refill  . ferrous sulfate 325 (65 FE) MG tablet Take 325 mg by mouth daily with breakfast.    . Multiple Vitamins-Minerals (MULTIVITAMIN ADULT PO) Take by mouth.    Marland Kitchen. omeprazole (PRILOSEC) 40 MG capsule Take 1 capsule (40 mg total) by mouth daily  before breakfast. 30 capsule 1  . simvastatin (ZOCOR) 20 MG tablet Take 1 tablet (20 mg total) by mouth daily with supper. 90 tablet 1   No current facility-administered medications on file prior to visit.      Past Medical History:  Diagnosis Date  . Anemia    No Known Allergies  Social History   Socioeconomic History  . Marital status: Single    Spouse name: None  . Number of children: None  . Years of education: None  . Highest education level: None  Social Needs  . Financial resource strain: None  . Food insecurity - worry: None  . Food insecurity - inability: None  . Transportation needs - medical: None  . Transportation needs - non-medical: None  Occupational History  . None  Tobacco Use  . Smoking status: Never Smoker  . Smokeless tobacco: Never Used  Substance and Sexual Activity  . Alcohol use: No    Alcohol/week: 0.0 oz  . Drug use: No  . Sexual activity: Not Currently  Other Topics Concern  . None  Social History Narrative  . None    Vitals:   07/26/17 1137  BP: 110/78  Pulse: 68  Resp: 12  Temp: 98.5 F (36.9 C)  SpO2: 99%   Body mass index is 29.97 kg/m.    Physical Exam  Nursing note and vitals reviewed. Constitutional: She is oriented to person, place, and time. She appears well-developed. No distress.  HENT:  Head: Normocephalic and atraumatic.  Mouth/Throat: Oropharynx is clear and moist and mucous membranes are normal.  Eyes: Conjunctivae are normal.  Cardiovascular: Normal rate and regular rhythm.  No murmur heard. Pulses:      Dorsalis pedis pulses are 2+ on the right side, and 2+ on the left side.  Respiratory: Effort normal and breath sounds normal. No respiratory distress.  Musculoskeletal: She exhibits no edema.       Right knee: She exhibits normal range of motion, no effusion and no deformity.       Left lower leg: She exhibits no tenderness.  Tenderness upon palpation of medial aspect of thigh right above knee. No  erythema, area is mildly more prominent when compare with left side. No ecchymosis or masses. Knee ROM is normal,it does not elicit pain. Holman;s sign negative. Non antalgic gait.  Neurological: She is alert and oriented to person, place, and time. She has normal strength. Gait normal.  Skin: Skin is warm. No ecchymosis and no rash noted. No erythema.  Psychiatric: Her affect is blunt.  Well groomed, good eye contact.    ASSESSMENT AND PLAN:  Brandi Diaz was seen today for right leg pain/swelling.  Diagnoses and all orders for this visit:  Pain of right lower extremity -     celecoxib (CELEBREX) 100 MG capsule; Take 1 capsule (100 mg total) by mouth 2 (two) times daily for 10 days.   We discussed possible etiologies, Hx and examination suggest muscle strain. I do not think imaging or lab work is necessary at this time but she was instructed about warning signs. Local ice or OTC topical icy hot. After discussion of some side effects she agrees with trying short course of Celebrex. Instructed about warning signs. Avoid running on the treadmills for a couple weeks and monitor for changes. She prefers to hold on ortho.   -Brandi Diaz was advised to seek immediate medical attention if sudden worsening symptoms or to follow if they persist or if new concerns arise.       Brandi Diaz G. [place], MD  [facility]. [place] office.

## 2017-08-23 ENCOUNTER — Other Ambulatory Visit: Payer: Self-pay | Admitting: Family Medicine

## 2018-03-02 ENCOUNTER — Other Ambulatory Visit: Payer: Self-pay | Admitting: Family Medicine

## 2018-03-19 ENCOUNTER — Other Ambulatory Visit: Payer: Self-pay | Admitting: Family Medicine

## 2018-03-19 DIAGNOSIS — Z1231 Encounter for screening mammogram for malignant neoplasm of breast: Secondary | ICD-10-CM

## 2018-04-23 ENCOUNTER — Ambulatory Visit
Admission: RE | Admit: 2018-04-23 | Discharge: 2018-04-23 | Disposition: A | Discharge status: Home / Self Care | Payer: BC Managed Care – PPO | Source: Ambulatory Visit | Attending: Family Medicine | Admitting: Family Medicine

## 2018-04-23 DIAGNOSIS — Z1231 Encounter for screening mammogram for malignant neoplasm of breast: Secondary | ICD-10-CM

## 2019-03-05 ENCOUNTER — Other Ambulatory Visit: Payer: Self-pay | Admitting: Family Medicine

## 2019-03-05 DIAGNOSIS — R519 Headache, unspecified: Secondary | ICD-10-CM

## 2019-03-19 ENCOUNTER — Other Ambulatory Visit: Payer: BC Managed Care – PPO

## 2019-03-22 ENCOUNTER — Other Ambulatory Visit: Payer: Self-pay

## 2019-03-22 ENCOUNTER — Ambulatory Visit
Admission: RE | Admit: 2019-03-22 | Discharge: 2019-03-22 | Disposition: A | Discharge status: Home / Self Care | Payer: BC Managed Care – PPO | Source: Ambulatory Visit | Attending: Family Medicine | Admitting: Family Medicine

## 2019-03-22 DIAGNOSIS — R519 Headache, unspecified: Secondary | ICD-10-CM

## 2019-03-26 ENCOUNTER — Other Ambulatory Visit: Payer: Self-pay | Admitting: Family Medicine

## 2019-03-26 DIAGNOSIS — R519 Headache, unspecified: Secondary | ICD-10-CM

## 2019-03-26 DIAGNOSIS — G43919 Migraine, unspecified, intractable, without status migrainosus: Secondary | ICD-10-CM

## 2019-03-26 DIAGNOSIS — H9313 Tinnitus, bilateral: Secondary | ICD-10-CM

## 2019-04-15 ENCOUNTER — Other Ambulatory Visit: Payer: Self-pay | Admitting: Family Medicine

## 2019-04-17 ENCOUNTER — Other Ambulatory Visit: Payer: Self-pay

## 2019-04-17 ENCOUNTER — Ambulatory Visit
Admission: RE | Admit: 2019-04-17 | Discharge: 2019-04-17 | Disposition: A | Discharge status: Home / Self Care | Payer: BC Managed Care – PPO | Source: Ambulatory Visit | Attending: Family Medicine | Admitting: Family Medicine

## 2019-04-17 DIAGNOSIS — G43919 Migraine, unspecified, intractable, without status migrainosus: Secondary | ICD-10-CM

## 2019-04-17 DIAGNOSIS — R519 Headache, unspecified: Secondary | ICD-10-CM

## 2019-04-17 DIAGNOSIS — H9313 Tinnitus, bilateral: Secondary | ICD-10-CM

## 2019-04-17 MED ORDER — GADOBENATE DIMEGLUMINE 529 MG/ML IV SOLN
10.0000 mL | Freq: Once | INTRAVENOUS | Status: AC | PRN
  Administered 2019-04-17: 10 mL via INTRAVENOUS

## 2019-04-26 ENCOUNTER — Other Ambulatory Visit: Payer: Self-pay | Admitting: Family Medicine

## 2019-04-26 DIAGNOSIS — Z1231 Encounter for screening mammogram for malignant neoplasm of breast: Secondary | ICD-10-CM

## 2019-05-14 ENCOUNTER — Encounter: Payer: Self-pay | Admitting: Neurology

## 2019-05-28 NOTE — Progress Notes (Signed)
NEUROLOGY CONSULTATION NOTE  Brandi Diaz MRN: [id number] DOB: [birth year]  Referring provider: Horald Pollen, MD Primary care provider: Horald Pollen, MD  Reason for consult:  headaches  HISTORY OF PRESENT ILLNESS: Brandi Diaz is a 58 year old female who presents for headaches.  History supplemented by referring provider note.  She started having headaches in 1998, off and on.  No preceding injury.  They stopped for several years but then returned in 2013.  They stopped again and then returned over this past summer.  She develops a moderate to severe diffuse throbbing headache that is constant and resolves when she is laying down.  As soon as she sits up, it immediately returns.  There is associated photophobia and phonophobia.  She has tinnitus.  No associated visual disturbance, nausea, vomiting or unilateral numbness or weakness.  S  MRI of brain with and without contrast from 04/17/2019 was personally reviewed and demonstrated sagging of the midbrain and cerebellar tonsils with effacement of the basilar cistern and prominence of the pituitary gland, suspicious for intracranial hypotension. Otherwise, unremarkable.  Current Antihypertensive medications:  Propranolol ER 60mg ;Benicar Current Antidepressant medications:  Amitriptyline 20mg   Past NSAIDS:  Advil, Aleve Past analgesics:  Tylenol, Excedrin  Caffeine:  No more than 1 cup a day but does not drink daily   PAST MEDICAL HISTORY: Past Medical History:  Diagnosis Date  . Anemia     PAST SURGICAL HISTORY: Past Surgical History:  Procedure Laterality Date  . CESAREAN SECTION  x3  . HEMORRHOID SURGERY    . TOTAL ABDOMINAL HYSTERECTOMY     nonmalignant reasons - ovaries intact  . TUBAL LIGATION      MEDICATIONS: Current Outpatient Medications on File Prior to Visit  Medication Sig Dispense Refill  . ferrous sulfate 325 (65 FE) MG tablet Take 325 mg by mouth daily with breakfast.    . Multiple Vitamins-Minerals  (MULTIVITAMIN ADULT PO) Take by mouth.    Marland Kitchen omeprazole (PRILOSEC) 40 MG capsule Take 1 capsule (40 mg total) by mouth daily before breakfast. 30 capsule 1  . simvastatin (ZOCOR) 20 MG tablet TAKE 1 TABLET BY MOUTH DAILY WITH SUPPER. 90 tablet 1   No current facility-administered medications on file prior to visit.     ALLERGIES: No Known Allergies  FAMILY HISTORY: Family History  Problem Relation Age of Onset  . Diabetes Other   . Hypertension Other   . Diabetes Mother   . Colon cancer Neg Hx   . Breast cancer Neg Hx     SOCIAL HISTORY: Social History   Socioeconomic History  . Marital status: Single    Spouse name: Not on file  . Number of children: Not on file  . Years of education: Not on file  . Highest education level: Not on file  Occupational History  . Not on file  Social Needs  . Financial resource strain: Not on file  . Food insecurity    Worry: Not on file    Inability: Not on file  . Transportation needs    Medical: Not on file    Non-medical: Not on file  Tobacco Use  . Smoking status: Never Smoker  . Smokeless tobacco: Never Used  Substance and Sexual Activity  . Alcohol use: No    Alcohol/week: 0.0 standard drinks  . Drug use: No  . Sexual activity: Not Currently  Lifestyle  . Physical activity    Days per week: Not on file    Minutes per session:  Not on file  . Stress: Not on file  Relationships  . Social Musician on phone: Not on file    Gets together: Not on file    Attends religious service: Not on file    Active member of club or organization: Not on file    Attends meetings of clubs or organizations: Not on file    Relationship status: Not on file  . Intimate partner violence    Fear of current or ex partner: Not on file    Emotionally abused: Not on file    Physically abused: Not on file    Forced sexual activity: Not on file  Other Topics Concern  . Not on file  Social History Narrative  . Not on file    REVIEW  OF SYSTEMS: Constitutional: No fevers, chills, or sweats, no generalized fatigue, change in appetite Eyes: No visual changes, double vision, eye pain Ear, nose and throat: No hearing loss, ear pain, nasal congestion, sore throat Cardiovascular: No chest pain, palpitations Respiratory:  No shortness of breath at rest or with exertion, wheezes GastrointestinaI: No nausea, vomiting, diarrhea, abdominal pain, fecal incontinence Genitourinary:  No dysuria, urinary retention or frequency Musculoskeletal:  No neck pain, back pain Integumentary: No rash, pruritus, skin lesions Neurological: as above Psychiatric: No depression, insomnia, anxiety Endocrine: No palpitations, fatigue, diaphoresis, mood swings, change in appetite, change in weight, increased thirst Hematologic/Lymphatic:  No purpura, petechiae. Allergic/Immunologic: no itchy/runny eyes, nasal congestion, recent allergic reactions, rashes  PHYSICAL EXAM: Blood pressure 100/66, pulse 75, height 5\' 9"  (1.753 m), weight 219 lb 9.6 oz (99.6 kg), SpO2 99 %. General: No acute distress.  Patient appears well-groomed.   Head:  Normocephalic/atraumatic Eyes:  fundi examined but not visualized Neck: supple, no paraspinal tenderness, full range of motion Back: No paraspinal tenderness Heart: regular rate and rhythm Lungs: Clear to auscultation bilaterally. Vascular: No carotid bruits. Neurological Exam: Mental status: alert and oriented to person, place, and time, recent and remote memory intact, fund of knowledge intact, attention and concentration intact, speech fluent and not dysarthric, language intact. Cranial nerves: CN I: not tested CN II: pupils equal, round and reactive to light, visual fields intact CN III, IV, VI:  full range of motion, no nystagmus, no ptosis CN V: facial sensation intact CN VII: upper and lower face symmetric CN VIII: hearing intact CN IX, X: gag intact, uvula midline CN XI: sternocleidomastoid and trapezius  muscles intact CN XII: tongue midline Bulk & Tone: normal, no fasciculations. Motor:  5/5 throughout  Sensation:  temperature and vibration sensation intact.   Deep Tendon Reflexes:  2+ throughout, toes downgoing.   Finger to nose testing:  Without dysmetria.   Heel to shin:  Without dysmetria.   Gait:  Normal station and stride.  Able to turn and tandem walk. Romberg negative.  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  [facility], DO  CC: Shon Millet, MD

## 2019-05-29 ENCOUNTER — Ambulatory Visit: Payer: BC Managed Care – PPO | Admitting: Neurology

## 2019-05-29 ENCOUNTER — Other Ambulatory Visit: Payer: Self-pay

## 2019-05-29 ENCOUNTER — Encounter: Payer: Self-pay | Admitting: Neurology

## 2019-05-29 VITALS — BP 100/66 | HR 75 | Ht 69.0 in | Wt 219.6 lb

## 2019-05-29 DIAGNOSIS — R51 Headache with orthostatic component, not elsewhere classified: Secondary | ICD-10-CM

## 2019-05-29 DIAGNOSIS — G9600 Cerebrospinal fluid leak, unspecified: Secondary | ICD-10-CM

## 2019-05-29 NOTE — Progress Notes (Signed)
Phone: [phone] opt. [address]

## 2019-05-29 NOTE — Patient Instructions (Addendum)
I will send you to [facility] Radiology where they treat spinal fluid leaks (this is what is causing your headache)   If you have not heard from them within the next 2-3 days please contact them at Phone: [phone] opt. [address]

## 2019-06-10 ENCOUNTER — Ambulatory Visit
Admission: RE | Admit: 2019-06-10 | Discharge: 2019-06-10 | Disposition: A | Discharge status: Home / Self Care | Payer: BC Managed Care – PPO | Source: Ambulatory Visit | Attending: Family Medicine | Admitting: Family Medicine

## 2019-06-10 ENCOUNTER — Other Ambulatory Visit: Payer: Self-pay

## 2019-06-10 DIAGNOSIS — Z1231 Encounter for screening mammogram for malignant neoplasm of breast: Secondary | ICD-10-CM

## 2019-06-13 ENCOUNTER — Ambulatory Visit: Payer: BC Managed Care – PPO | Admitting: Neurology

## 2019-09-02 ENCOUNTER — Telehealth: Payer: Self-pay | Admitting: Neurology

## 2019-09-02 NOTE — Telephone Encounter (Signed)
Please advise when you get back into the office

## 2019-09-02 NOTE — Telephone Encounter (Signed)
Patient called and said she needs to schedule a follow up appointment with Dr. Tomi Likens so she can be released back to work. Please advise on scheduling?

## 2019-09-08 NOTE — Telephone Encounter (Signed)
She will need to make a follow up appointment first since she had not yet been treated and was not doing well when I saw her.  Virtual visit is fine.

## 2019-09-09 NOTE — Telephone Encounter (Signed)
Left message for patient to call back  

## 2019-09-09 NOTE — Telephone Encounter (Signed)
Please schedule. thanks

## 2019-09-18 NOTE — Telephone Encounter (Signed)
Called patient to schedule a virtual visit. Patient said she doesn't have a phone or computer that she can use for a virtual visit at this time. She'd like to know if a telephone visit is an option.  Please advise?

## 2019-09-18 NOTE — Telephone Encounter (Signed)
If I am going to clear her to return to work, I would rather be able to see her.  Therefore, I recommend in-office visit.

## 2019-09-18 NOTE — Telephone Encounter (Signed)
Patient scheduled for 09/20/2019 at 3:30 PM for an in-office visit.

## 2019-09-18 NOTE — Telephone Encounter (Signed)
Please advise if this is an option

## 2019-09-18 NOTE — Telephone Encounter (Signed)
Please change visit thxs

## 2019-09-19 NOTE — Progress Notes (Signed)
NEUROLOGY FOLLOW UP OFFICE NOTE  Marlei Glomski [id number]  HISTORY OF PRESENT ILLNESS: Brandi Diaz is a 59 year old female who follows up for spontaneous intracranial hypotension.  UPDATE: She was referred to [facility] Radiology for evaluation and treatment of her recurrent spontaneous CSF leak.  CT myelogram showed source of leak at right T4/5, T5/6, and L3/4.  She underwent epidural blood patch and headaches resolved.  HISTORY: She started having headaches in 1998, off and on.  No preceding injury.  They stopped for several years but then returned in 2013.  They stopped again and then returned over this past summer.  She develops a moderate to severe diffuse throbbing headache that is constant and resolves when she is laying down.  As soon as she sits up, it immediately returns.  There is associated photophobia and phonophobia.  She has tinnitus.  No associated visual disturbance, nausea, vomiting or unilateral numbness or weakness.  S  MRI of brain with and without contrast from 04/17/2019 was personally reviewed and demonstrated sagging of the midbrain and cerebellar tonsils with effacement of the basilar cistern and prominence of the pituitary gland, suspicious for intracranial hypotension. Otherwise, unremarkable.  Current Antihypertensive medications:  Propranolol ER 60mg ;Benicar Current Antidepressant medications:  Amitriptyline 20mg   Past NSAIDS:  Advil, Aleve Past analgesics:  Tylenol, Excedrin  Caffeine:  No more than 1 cup a day but does not drink daily  PAST MEDICAL HISTORY: Past Medical History:  Diagnosis Date  . Anemia   . High cholesterol     MEDICATIONS: Current Outpatient Medications on File Prior to Visit  Medication Sig Dispense Refill  . amitriptyline (ELAVIL) 10 MG tablet Take 20 mg by mouth at bedtime.    Marland Kitchen atorvastatin (LIPITOR) 40 MG tablet Take 40 mg by mouth daily.    . ferrous sulfate 325 (65 FE) MG tablet Take 325 mg by mouth daily with  breakfast.    . olmesartan-hydrochlorothiazide (BENICAR HCT) 20-12.5 MG tablet Take 1 tablet by mouth daily.    . propranolol ER (INDERAL LA) 60 MG 24 hr capsule Take by mouth daily.    . simvastatin (ZOCOR) 20 MG tablet TAKE 1 TABLET BY MOUTH DAILY WITH SUPPER. 90 tablet 1   No current facility-administered medications on file prior to visit.    ALLERGIES: No Known Allergies  FAMILY HISTORY: Family History  Problem Relation Age of Onset  . Diabetes Other   . Hypertension Other   . Diabetes Mother   . Hypertension Mother   . Hypertension Father   . Hypertension Sister   . Hypertension Brother   . Healthy Child   . Colon cancer Neg Hx   . Breast cancer Neg Hx   .  SOCIAL HISTORY: Social History   Socioeconomic History  . Marital status: Single    Spouse name: Not on file  . Number of children: 3  . Years of education: Not on file  . Highest education level: High school graduate  Occupational History  . Not on file  Tobacco Use  . Smoking status: Never Smoker  . Smokeless tobacco: Never Used  Substance and Sexual Activity  . Alcohol use: No    Alcohol/week: 0.0 standard drinks  . Drug use: No  . Sexual activity: Not Currently  Other Topics Concern  . Not on file  Social History Narrative   Pt lives with her mother, daughter and brother, one story home   Drinks coffee, tea and soda   Right handed  Social Determinants of Health   Financial Resource Strain:   . Difficulty of Paying Living Expenses: Not on file  Food Insecurity:   . Worried About Programme researcher, broadcasting/film/video in the Last Year: Not on file  . Ran Out of Food in the Last Year: Not on file  Transportation Needs:   . Lack of Transportation (Medical): Not on file  . Lack of Transportation (Non-Medical): Not on file  Physical Activity:   . Days of Exercise per Week: Not on file  . Minutes of Exercise per Session: Not on file  Stress:   . Feeling of Stress : Not on file  Social Connections:   . Frequency  of Communication with Friends and Family: Not on file  . Frequency of Social Gatherings with Friends and Family: Not on file  . Attends Religious Services: Not on file  . Active Member of Clubs or Organizations: Not on file  . Attends Banker Meetings: Not on file  . Marital Status: Not on file  Intimate Partner Violence:   . Fear of Current or Ex-Partner: Not on file  . Emotionally Abused: Not on file  . Physically Abused: Not on file  . Sexually Abused: Not on file    REVIEW OF SYSTEMS: Constitutional: No fevers, chills, or sweats, no generalized fatigue, change in appetite Eyes: No visual changes, double vision, eye pain Ear, nose and throat: No hearing loss, ear pain, nasal congestion, sore throat Cardiovascular: No chest pain, palpitations Respiratory:  No shortness of breath at rest or with exertion, wheezes GastrointestinaI: No nausea, vomiting, diarrhea, abdominal pain, fecal incontinence Genitourinary:  No dysuria, urinary retention or frequency Musculoskeletal:  No neck pain, back pain Integumentary: No rash, pruritus, skin lesions Neurological: as above Psychiatric: No depression, insomnia, anxiety Endocrine: No palpitations, fatigue, diaphoresis, mood swings, change in appetite, change in weight, increased thirst Hematologic/Lymphatic:  No purpura, petechiae. Allergic/Immunologic: no itchy/runny eyes, nasal congestion, recent allergic reactions, rashes  PHYSICAL EXAM: Blood pressure 123/77, pulse 76, height 5\' 9"  (1.753 m), weight 221 lb (100.2 kg), SpO2 99 %. General: No acute distress.  Patient appears well-groomed.   Head:  Normocephalic/atraumatic Eyes:  Fundi examined but not visualized Neck: supple, no paraspinal tenderness, full range of motion Heart:  Regular rate and rhythm Lungs:  Clear to auscultation bilaterally Back: No paraspinal tenderness Neurological Exam: alert and oriented to person, place, and time. Attention span and concentration  intact, recent and remote memory intact, fund of knowledge intact.  Speech fluent and not dysarthric, language intact.  CN II-XII intact. Bulk and tone normal, muscle strength 5/5 throughout.  Sensation to light touch, temperature and vibration intact.  Deep tendon reflexes 2+ throughout, toes downgoing.  Finger to nose and heel to shin testing intact.  Gait normal, Romberg negative.  IMPRESSION: Spontaneous CSF leak, resolved  From my standpoint, she may return to work without restrictions.  Time spent in total with patient:  15 minutes.  , DO  CC: Shon Millet, MD

## 2019-09-20 ENCOUNTER — Encounter: Payer: Self-pay | Admitting: Neurology

## 2019-09-20 ENCOUNTER — Ambulatory Visit: Payer: BC Managed Care – PPO | Admitting: Neurology

## 2019-09-20 ENCOUNTER — Other Ambulatory Visit: Payer: Self-pay

## 2019-09-20 VITALS — BP 123/77 | HR 76 | Ht 69.0 in | Wt 221.0 lb

## 2019-09-20 DIAGNOSIS — G96811 Intracranial hypotension, spontaneous: Secondary | ICD-10-CM

## 2019-09-20 NOTE — Patient Instructions (Signed)
You may resume all activities without restrictions

## 2019-09-23 ENCOUNTER — Encounter: Payer: Self-pay | Admitting: Neurology

## 2020-05-05 ENCOUNTER — Other Ambulatory Visit: Payer: Self-pay | Admitting: Family Medicine

## 2020-05-05 DIAGNOSIS — Z1231 Encounter for screening mammogram for malignant neoplasm of breast: Secondary | ICD-10-CM

## 2020-06-10 ENCOUNTER — Ambulatory Visit
Admission: RE | Admit: 2020-06-10 | Discharge: 2020-06-10 | Disposition: A | Discharge status: Home / Self Care | Payer: BC Managed Care – PPO | Source: Ambulatory Visit | Attending: Family Medicine | Admitting: Family Medicine

## 2020-06-10 ENCOUNTER — Other Ambulatory Visit: Payer: Self-pay

## 2020-06-10 DIAGNOSIS — Z1231 Encounter for screening mammogram for malignant neoplasm of breast: Secondary | ICD-10-CM

## 2021-05-04 ENCOUNTER — Other Ambulatory Visit: Payer: Self-pay | Admitting: Family Medicine

## 2021-05-04 DIAGNOSIS — Z1231 Encounter for screening mammogram for malignant neoplasm of breast: Secondary | ICD-10-CM

## 2021-07-02 ENCOUNTER — Ambulatory Visit
Admission: RE | Admit: 2021-07-02 | Discharge: 2021-07-02 | Disposition: A | Discharge status: Home / Self Care | Payer: BC Managed Care – PPO | Source: Ambulatory Visit | Attending: Family Medicine | Admitting: Family Medicine

## 2021-07-02 DIAGNOSIS — Z1231 Encounter for screening mammogram for malignant neoplasm of breast: Secondary | ICD-10-CM

## 2021-11-29 ENCOUNTER — Other Ambulatory Visit: Payer: Self-pay | Admitting: Neuroradiology

## 2021-11-29 DIAGNOSIS — G96810 Intracranial hypotension, unspecified: Secondary | ICD-10-CM

## 2021-12-10 ENCOUNTER — Ambulatory Visit
Admission: RE | Admit: 2021-12-10 | Discharge: 2021-12-10 | Disposition: A | Discharge status: Home / Self Care | Payer: BC Managed Care – PPO | Source: Ambulatory Visit | Attending: Neuroradiology | Admitting: Neuroradiology

## 2021-12-10 DIAGNOSIS — G96810 Intracranial hypotension, unspecified: Secondary | ICD-10-CM

## 2021-12-10 MED ORDER — GADOBENATE DIMEGLUMINE 529 MG/ML IV SOLN
20.0000 mL | Freq: Once | INTRAVENOUS | Status: AC | PRN
  Administered 2021-12-10: 20 mL via INTRAVENOUS

## 2022-05-30 ENCOUNTER — Other Ambulatory Visit: Payer: Self-pay | Admitting: Family Medicine

## 2022-05-30 DIAGNOSIS — Z1231 Encounter for screening mammogram for malignant neoplasm of breast: Secondary | ICD-10-CM

## 2022-06-24 ENCOUNTER — Other Ambulatory Visit (HOSPITAL_COMMUNITY): Payer: Self-pay | Admitting: Neurosurgery

## 2022-06-24 ENCOUNTER — Other Ambulatory Visit: Payer: Self-pay | Admitting: Neurosurgery

## 2022-06-24 DIAGNOSIS — G9608 Other cranial cerebrospinal fluid leak: Secondary | ICD-10-CM

## 2022-07-05 ENCOUNTER — Ambulatory Visit: Payer: BC Managed Care – PPO

## 2022-07-08 ENCOUNTER — Ambulatory Visit (HOSPITAL_COMMUNITY)
Admission: RE | Admit: 2022-07-08 | Discharge: 2022-07-08 | Disposition: A | Discharge status: Home / Self Care | Payer: BC Managed Care – PPO | Source: Ambulatory Visit | Attending: Neurosurgery | Admitting: Neurosurgery

## 2022-07-08 DIAGNOSIS — I9589 Other hypotension: Secondary | ICD-10-CM | POA: Insufficient documentation

## 2022-07-08 DIAGNOSIS — G9608 Other cranial cerebrospinal fluid leak: Secondary | ICD-10-CM | POA: Insufficient documentation

## 2022-07-08 MED ORDER — GADOBUTROL 1 MMOL/ML IV SOLN
10.0000 mL | Freq: Once | INTRAVENOUS | Status: AC | PRN
  Administered 2022-07-08: 10 mL via INTRAVENOUS

## 2022-08-26 ENCOUNTER — Ambulatory Visit
Admission: RE | Admit: 2022-08-26 | Discharge: 2022-08-26 | Disposition: A | Discharge status: Home / Self Care | Payer: BC Managed Care – PPO | Source: Ambulatory Visit | Attending: Family Medicine | Admitting: Family Medicine

## 2022-08-26 DIAGNOSIS — Z1231 Encounter for screening mammogram for malignant neoplasm of breast: Secondary | ICD-10-CM

## 2022-08-31 ENCOUNTER — Ambulatory Visit: Payer: BC Managed Care – PPO

## 2023-07-03 ENCOUNTER — Other Ambulatory Visit: Payer: Self-pay | Admitting: Family Medicine

## 2023-07-03 DIAGNOSIS — Z1231 Encounter for screening mammogram for malignant neoplasm of breast: Secondary | ICD-10-CM

## 2023-08-28 ENCOUNTER — Ambulatory Visit
Admission: RE | Admit: 2023-08-28 | Discharge: 2023-08-28 | Disposition: A | Discharge status: Home / Self Care | Payer: BC Managed Care – PPO | Source: Ambulatory Visit | Attending: Family Medicine | Admitting: Family Medicine

## 2023-08-28 DIAGNOSIS — Z1231 Encounter for screening mammogram for malignant neoplasm of breast: Secondary | ICD-10-CM

## 2024-07-31 ENCOUNTER — Other Ambulatory Visit: Payer: Self-pay | Admitting: Family Medicine

## 2024-07-31 DIAGNOSIS — Z1231 Encounter for screening mammogram for malignant neoplasm of breast: Secondary | ICD-10-CM

## 2024-08-30 ENCOUNTER — Ambulatory Visit
Admission: RE | Admit: 2024-08-30 | Discharge: 2024-08-30 | Disposition: A | Discharge status: Home / Self Care | Source: Ambulatory Visit | Attending: Family Medicine | Admitting: Family Medicine

## 2024-08-30 DIAGNOSIS — Z1231 Encounter for screening mammogram for malignant neoplasm of breast: Secondary | ICD-10-CM

## 2024-09-04 ENCOUNTER — Other Ambulatory Visit: Payer: Self-pay | Admitting: Family Medicine

## 2024-09-04 DIAGNOSIS — R748 Abnormal levels of other serum enzymes: Secondary | ICD-10-CM

## 2024-09-19 ENCOUNTER — Ambulatory Visit
Admission: RE | Admit: 2024-09-19 | Discharge: 2024-09-19 | Disposition: A | Source: Ambulatory Visit | Attending: Family Medicine | Admitting: Family Medicine

## 2024-09-19 DIAGNOSIS — R748 Abnormal levels of other serum enzymes: Secondary | ICD-10-CM

## 2024-10-10 ENCOUNTER — Encounter: Admitting: Skilled Nursing Facility1

## 2024-10-10 ENCOUNTER — Encounter: Payer: Self-pay | Admitting: Skilled Nursing Facility1

## 2024-10-10 NOTE — Progress Notes (Unsigned)
 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 Pt state she is too tired to even eat sometimes. Pt states if she does get a break she goes to her moms house to check on her.  Pt states she works 50 hours a week (10 hour days) working 5 days and weekends off. Pt states she lives with her brother and mother.   Estimated daily fluid intake:  oz Supplements:  Sleep:  Stress / self-care:  Current average weekly physical activity: ADL  24-Hr Dietary Recall: wakes around 4am First Meal : skipped or grits + butter + salt Snack: belvita crackers  Second Meal: peanut butter crackers Snack:  Third Meal after 6pm: chicken + mac n cheese Snack:  Beverages: body armour water, water, applejuice    NUTRITION INTERVENTION  Nutrition education (E-1) on the following topics:  Creation of balanced and diverse meals to increase the intake of nutrient-rich foods that provide essential vitamins, minerals, fiber, and phytonutrients Variety of Fruits and Vegetables: Aim for a colorful array of fruits and vegetables to ensure a wide range of nutrients. Include a mix of leafy greens, berries, citrus fruits, cruciferous vegetables, and more. Whole Grains: Choose whole grains over refined grains. Examples include brown rice, quinoa, oats, whole wheat, and barley. Lean Proteins: Include lean sources of protein, such as poultry, fish, tofu, legumes, beans, lentils, and low-fat dairy products. Limit red and processed  meats. Healthy Fats: Incorporate sources of healthy fats, including avocados, nuts, seeds, and olive oil. Limit saturated and trans fats found in fried and processed foods. Dairy or Dairy Alternatives: Choose low-fat or fat-free dairy products, or plant-based alternatives like almond or soy milk. Portion Control: Be mindful of portion sizes to avoid overeating. Pay attention to hunger and satisfaction cues. Limit Added Sugars: Minimize the consumption of sugary beverages, snacks, and desserts. Check food labels for added sugars and opt for natural sources of sweetness such as whole fruits. Hydration: Drink plenty of water throughout the day. Limit sugary drinks and excessive caffeine intake. Moderate Sodium Intake: Reduce the consumption of high-sodium foods. Use herbs and spices for flavor instead of excessive salt. Meal Planning and Preparation: Plan and prepare meals ahead of time to make healthier choices more convenient. Include a mix of food groups in each meal. Limit Processed Foods: Minimize the intake of highly processed and packaged foods that are often high in added sugars, salt, and unhealthy fats. Regular Physical Activity: Combine a healthy diet with regular physical activity for overall well-being. Aim for at least 150 minutes of moderate-intensity aerobic exercise per week, along with strength training. Moderation and Balance: Enjoy treats and indulgent foods in moderation, emphasizing balance rather than strict restriction.  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  Next Steps  Patient is to ***.
